# Patient Record
Sex: Female | Born: 1970 | Race: White | Hispanic: No | Marital: Single | Smoking: Former smoker
Health system: Southern US, Community
[De-identification: ages and names within clinical notes are randomized; demographics above are authoritative.]

## PROBLEM LIST (undated history)

## (undated) DIAGNOSIS — R87629 Unspecified abnormal cytological findings in specimens from vagina: Secondary | ICD-10-CM

## (undated) DIAGNOSIS — I10 Essential (primary) hypertension: Secondary | ICD-10-CM

## (undated) DIAGNOSIS — K219 Gastro-esophageal reflux disease without esophagitis: Secondary | ICD-10-CM

## (undated) HISTORY — PX: CERVICAL BIOPSY  W/ LOOP ELECTRODE EXCISION: SUR135

## (undated) HISTORY — PX: TONSILLECTOMY: SUR1361

## (undated) HISTORY — DX: Essential (primary) hypertension: I10

## (undated) HISTORY — PX: COLPOSCOPY: SHX161

## (undated) HISTORY — PX: LEEP: SHX91

## (undated) HISTORY — DX: Unspecified abnormal cytological findings in specimens from vagina: R87.629

---

## 2001-08-11 ENCOUNTER — Emergency Department (HOSPITAL_COMMUNITY): Admission: EM | Admit: 2001-08-11 | Discharge: 2001-08-11 | Payer: Self-pay | Admitting: Emergency Medicine

## 2001-08-11 ENCOUNTER — Encounter: Payer: Self-pay | Admitting: Emergency Medicine

## 2005-08-14 ENCOUNTER — Emergency Department (HOSPITAL_COMMUNITY): Admission: EM | Admit: 2005-08-14 | Discharge: 2005-08-14 | Payer: Self-pay | Admitting: Emergency Medicine

## 2010-01-30 ENCOUNTER — Encounter: Admission: RE | Admit: 2010-01-30 | Discharge: 2010-01-30 | Payer: Self-pay | Admitting: Family Medicine

## 2010-10-25 ENCOUNTER — Encounter: Payer: Self-pay | Admitting: Family Medicine

## 2015-06-17 DIAGNOSIS — I361 Nonrheumatic tricuspid (valve) insufficiency: Secondary | ICD-10-CM | POA: Insufficient documentation

## 2015-09-09 DIAGNOSIS — N83201 Unspecified ovarian cyst, right side: Secondary | ICD-10-CM | POA: Insufficient documentation

## 2015-10-21 ENCOUNTER — Other Ambulatory Visit: Payer: Self-pay

## 2015-10-21 DIAGNOSIS — Z1231 Encounter for screening mammogram for malignant neoplasm of breast: Secondary | ICD-10-CM

## 2015-10-27 ENCOUNTER — Other Ambulatory Visit (HOSPITAL_COMMUNITY): Payer: Self-pay | Admitting: *Deleted

## 2015-10-27 DIAGNOSIS — N644 Mastodynia: Secondary | ICD-10-CM

## 2015-10-30 ENCOUNTER — Ambulatory Visit (HOSPITAL_COMMUNITY)
Admission: RE | Admit: 2015-10-30 | Discharge: 2015-10-30 | Disposition: A | Discharge status: Home / Self Care | Payer: No Typology Code available for payment source | Source: Ambulatory Visit | Attending: Obstetrics and Gynecology | Admitting: Obstetrics and Gynecology

## 2015-10-30 ENCOUNTER — Ambulatory Visit
Admission: RE | Admit: 2015-10-30 | Discharge: 2015-10-30 | Disposition: A | Discharge status: Home / Self Care | Payer: No Typology Code available for payment source | Source: Ambulatory Visit | Attending: Obstetrics and Gynecology | Admitting: Obstetrics and Gynecology

## 2015-10-30 ENCOUNTER — Encounter (HOSPITAL_COMMUNITY): Payer: Self-pay

## 2015-10-30 VITALS — BP 142/86 | Temp 98.2°F | Ht 63.0 in | Wt 171.0 lb

## 2015-10-30 DIAGNOSIS — Z1239 Encounter for other screening for malignant neoplasm of breast: Secondary | ICD-10-CM

## 2015-10-30 DIAGNOSIS — N644 Mastodynia: Secondary | ICD-10-CM

## 2015-10-30 NOTE — Progress Notes (Signed)
Complaints of right breast pain since December 2016 that comes and goes. Patient rated pain at a 1 out of 10 stating it happens at random times.  Pap Smear: Pap smear not completed today. Last Pap smear was in October 2014 at [facility] and normal per patient. Per patient has a history of an abnormal Pap smear 12 years ago that a LEEP was completed for follow up. Patient stated she has had 10 normal Pap smears since LEEP. No Pap smear results in EPIC.  Physical exam: Breasts Breasts symmetrical. No skin abnormalities bilateral breasts. No nipple retraction bilateral breasts. No nipple discharge bilateral breasts. No lymphadenopathy. No lumps palpated bilateral breasts. No complaints of pain or tenderness on exam. Patient complained of the pain being located in her right inner breast. Referred patient to the [place] for diagnostic mammogram. Appointment scheduled for Thursday, October 30, 2015 at 1530.  Pelvic/Bimanual No Pap smear completed today since last Pap smear was in October 2014 per patient. Pap smear not indicated per BCCCP guidelines.   Smoking History: Smoking cessation discussed with patient. Referred patient to the [facility] Quitline and gave resources to the free smoking cessation classes offered at the [facility].  Patient Navigation: Patient education provided. Access to services provided for patient through [facility] program.

## 2015-10-30 NOTE — Patient Instructions (Signed)
Educational materials on self breast awareness given. Explained to Maria Mccarty that she did not need a Pap smear today due to last Pap smear was in October 2014 per patient. Let her know Maria Mccarty will cover Pap smears every 3 years unless has a history of abnormal Pap smears. Referred patient to the [facility] for diagnostic mammogram. Appointment scheduled for Thursday, October 30, 2015 at 1530. Patient aware of appointment and will be there. Smoking cessation discussed with patient. Referred patient to the [facility] Quitline and gave resources to the free smoking cessation classes offered at the [facility]. Maria Mccarty verbalized understanding.  Maria Mccarty, Maria Chaco, RN 2:10 PM

## 2015-11-03 ENCOUNTER — Encounter (HOSPITAL_COMMUNITY): Payer: Self-pay | Admitting: *Deleted

## 2015-11-13 ENCOUNTER — Other Ambulatory Visit: Payer: Self-pay

## 2015-11-13 ENCOUNTER — Ambulatory Visit (HOSPITAL_BASED_OUTPATIENT_CLINIC_OR_DEPARTMENT_OTHER): Payer: Self-pay

## 2015-11-13 VITALS — BP 148/102 | HR 68 | Temp 98.0°F | Resp 16 | Ht 63.0 in | Wt 167.5 lb

## 2015-11-13 DIAGNOSIS — Z0000 Encounter for general adult medical examination without abnormal findings: Secondary | ICD-10-CM

## 2015-11-13 LAB — GLUCOSE (CC13): Glucose: 94 mg/dl (ref 70–140)

## 2015-11-13 NOTE — Progress Notes (Signed)
Patient is a new patient to the [facility] program and is currently a [facility] patient effective 10/30/2015 .  Clinical Measurements: Patient is 5 ft. 3 inches, weight 167.5 lbs, BMI 29.7.   Medical History: Patient has no history That she knows of high cholesterol. Patient does not have a history of hypertension or diabetes. Patient stated has noticed that her blood pressure's have been a little high. Per patient no diagnosed history of coronary heart disease, heart attack, heart failure, stroke/TIA, vascular disease or congenital heart defects.   Blood Pressure, Self-measurement: Patient states has no reason to check Blood pressure.  Nutrition Assessment: Patient stated that eats 2 fruits every day. Patient states she eats 2 servings of vegetables a day. Per patient does not eat 3 or more ounces of whole grains daily. Patient stated that doesn't eat two or more servings of fish weekly. Patient states does like seafood but had a numbing reaction to possible red tide. She got sick on it when she was young. Patient states she does not drink more than 36 ounces or 450 calories of beverages with added sugars weekly. Patient states she drinks a lot of water. Patient stated she does watch her salt intake.   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  Smoking Status: Patient stated smokes occasionally and is not exposed to smoke.  Quality of Life Assessment: In assessing patient's quality of life she stated that out of the past 30 days that she has felt her health is good all of them. Patient also stated that in the past 30 days that her mental health was good including stress, depression and problems with emotions for all days. Patient did state that out of the past 30 days she felt her physical or mental health had not kept her from doing her usual activities including self-care, work or recreation.   Plan: Lab work will be  done today including a lipid panel, blood glucose, and Hgb A1C. Will call lab results when they are finished. Patient will be getting visit to doctor for blood pressure. Will work on behavior modifications changeable risk factors that we discussed to lower BP.

## 2015-11-13 NOTE — Patient Instructions (Signed)
Discussed health assessment with patient. Talked with patient about smoking cessation and gave resources. Gave patient all the dates, times and location. Referred patient to [facility] for high blood pressure. Will quit smoking. Patient verbalized understanding.

## 2015-11-14 LAB — LIPID PANEL
CHOLESTEROL TOTAL: 176 mg/dL (ref 100–199)
Chol/HDL Ratio: 2.7 ratio units (ref 0.0–4.4)
HDL: 66 mg/dL (ref >39)
LDL Calculated: 99 mg/dL (ref 0–99)
Triglycerides: 53 mg/dL (ref 0–149)
VLDL Cholesterol Cal: 11 mg/dL (ref 5–40)

## 2015-11-14 LAB — HEMOGLOBIN A1C
Est. average glucose Bld gHb Est-mCnc: 105 mg/dL
HEMOGLOBIN A1C: 5.3 % (ref 4.8–5.6)

## 2015-11-18 ENCOUNTER — Telehealth: Payer: Self-pay

## 2015-11-18 NOTE — Telephone Encounter (Signed)
Called to inform about lab work from 11/13/15. I informed patient: cholesterol- 176, HDL- 66, LDL- 99, triglycerides - 53, Bld Glucose -94 , HBG-A1C - 5.3, and BMI 29.7. Told patient of appointment at [facility] on Wednesday, Fenruary 22 at 2 PM for elevated blood Pressure. Reassured patient that aware that blood pressure normally is not that high but I am required to send her. Facility address and telephone number given. Patient was informed not to let doctor do any other lab work or injections. [facility] does not cover and patient would be billed.   Did risk reduction counseling/Heach Coach concerning related to BMI/Weight. Patient and I discussed for twenty minutes her eating and exercise. Patient stated did not feel she was eating enough. Patient said that was eating healthy food. Discussed number of calories and weight she should be. Discussed serving sizes and reminded her of her WISEWOMAN overview sheet that received at screening. WSEWOMAN sheet has all food group minimal servings required for day for1600 calorie diet. Discussed exercise and [employer]. Informed of scholarship program at [employer] and how to get application. Discussed other programs that patient has done and compared programs. Discussed about not losing over 2 lbs a week and is better to lose slowly.  NAVIGATION NEEDS ASSESSMENT AND CARE PLAN: Patient does not need additional social support or lack access to services.Patient understands why she has to be followed up for blood pressure. Only barrier may have been that patient is not aware of some services to assist the low income. Patient has been directed to services for [facility].  PLAN: Patient will attend doctor's appointment. Will call patient back after appointment to see how appointment went. Patient and I will discuss more health coaching concerning patients exercise and weight loss goals.

## 2015-11-26 ENCOUNTER — Ambulatory Visit: Payer: No Typology Code available for payment source | Admitting: Internal Medicine

## 2016-01-13 ENCOUNTER — Telehealth: Payer: Self-pay

## 2016-01-13 NOTE — Telephone Encounter (Signed)
Called to follow up and see how was doing with her blood pressure. No answer and left message to return call.

## 2016-02-04 ENCOUNTER — Telehealth: Payer: Self-pay

## 2016-02-04 NOTE — Telephone Encounter (Signed)
Called to follow up on lab results.. Asked patient to return call.

## 2016-03-29 ENCOUNTER — Telehealth: Payer: Self-pay

## 2016-03-29 NOTE — Telephone Encounter (Signed)
This is third attempt to reach patient for health coaching. Patient has not returned calls from messages left and did not go to doctor's appointment. Willattempt one more time.

## 2016-05-03 ENCOUNTER — Telehealth: Payer: Self-pay

## 2016-05-03 NOTE — Telephone Encounter (Signed)
SECOND & FINAL HEALTH COACH SESSION: Called patient and she apologized for not have returned calls. Patient talked about what had been doing. I asked patient about whether she had checked her blood pressure since had not been to the doctor's appointment. Patient talked about how needed money and work that has been doing. Per patient has been checking blood pressure. Patient stated that pressure was sometimes good and sometimes bad. We reviewed changeable and non changeable risk factors for blood pressure. Discussed with patient that really needed to see a doctor and we discussed things that can happen to heart when BP goes untreated or remains borderline. Explained where and how cold obtain a primary care doctor. Patient stated that batteries to her scales were out and needed to get some. Patient stated that had a treadmill. Asked patient how she manages stress. Patient stated that has a hard time when it is related to her Children. Discussed different stress release options. Reminded patient because asked about PAP smear that can come back to [facility] at the end of January and [place] in February. This is final Health Coach Session PLAN:   Will call for Final Assessment in two weeks. Will work on stress relief. Will seek Primary Care Doctor.  TIME: 25 minutes

## 2016-09-30 ENCOUNTER — Encounter (HOSPITAL_COMMUNITY): Payer: Self-pay

## 2016-09-30 ENCOUNTER — Ambulatory Visit (HOSPITAL_COMMUNITY)
Admission: RE | Admit: 2016-09-30 | Discharge: 2016-09-30 | Disposition: A | Discharge status: Home / Self Care | Payer: Self-pay | Source: Ambulatory Visit | Attending: Obstetrics and Gynecology | Admitting: Obstetrics and Gynecology

## 2016-09-30 VITALS — BP 140/100 | Temp 98.1°F | Ht 63.0 in | Wt 167.4 lb

## 2016-09-30 DIAGNOSIS — Z01419 Encounter for gynecological examination (general) (routine) without abnormal findings: Secondary | ICD-10-CM

## 2016-09-30 HISTORY — DX: Gastro-esophageal reflux disease without esophagitis: K21.9

## 2016-09-30 NOTE — Progress Notes (Signed)
Pap smear completed

## 2016-09-30 NOTE — Progress Notes (Signed)
Complaints of AUB x 6 months.  Pap Smear: Pap smear completed today. Last Pap smear was in October 2014 at [facility] and normal per patient. Per patient has a history of an abnormal Pap smear 12 years ago that a LEEP was completed for follow up. Patient stated she has had 10 normal Pap smears since LEEP. No Pap smear results in EPIC.  Pelvic/Bimanual   Ext Genitalia No lesions, no swelling and no discharge observed on external genitalia.         Vagina Vagina pink and normal texture. No lesions and brownish colored blood observed in vagina.          Cervix Cervix is present. Cervix pink and of normal texture. Brownish colored blood observed on cervical os.    Uterus Uterus is present and palpable. Uterus in normal position and normal size.        Adnexae Bilateral ovaries present and palpable. No tenderness on palpation.          Rectovaginal No rectal exam completed today since patient had no rectal complaints. No skin abnormalities observed on exam.    Smoking History: Patient has never smoked.  Patient Navigation: Patient education provided. Access to services provided for patient through [facility] program.

## 2016-09-30 NOTE — Patient Instructions (Addendum)
Explained to Maria Mccarty that [employer] covered Pap smears and HPV typing every 5 years unless has a history of an abnormal Pap smear. Let patient know she may need a Pap smear earlier depending on result due to the vaginal bleeding. Offered to refer patient to the [facility] [place] [facility] to follow up for her AUB. Patient agreed and will refer her to the [facility] [place] [facility] for follow up of abnormal AUB. Let patient know will follow up with her within the next couple weeks with results of Pap smear by phone. Maria Savage Schuld verbalized understanding.  Jameya Pontiff, Arvil Chaco, RN 11:44 AM

## 2016-10-05 LAB — CYTOLOGY - PAP
DIAGNOSIS: NEGATIVE
HPV: NOT DETECTED

## 2016-10-13 ENCOUNTER — Telehealth (HOSPITAL_COMMUNITY): Payer: Self-pay | Admitting: *Deleted

## 2016-10-13 NOTE — Telephone Encounter (Signed)
Telephoned patient at home number and discussed negative pap smear results. HPV was negative. Next pap smear due in five years. Patient voiced understanding.

## 2016-10-25 ENCOUNTER — Other Ambulatory Visit: Payer: Self-pay | Admitting: Obstetrics and Gynecology

## 2016-10-25 DIAGNOSIS — Z1231 Encounter for screening mammogram for malignant neoplasm of breast: Secondary | ICD-10-CM

## 2016-11-03 ENCOUNTER — Encounter: Payer: Self-pay | Admitting: Obstetrics & Gynecology

## 2016-11-03 ENCOUNTER — Ambulatory Visit (INDEPENDENT_AMBULATORY_CARE_PROVIDER_SITE_OTHER): Payer: Self-pay | Admitting: Obstetrics & Gynecology

## 2016-11-03 DIAGNOSIS — N939 Abnormal uterine and vaginal bleeding, unspecified: Secondary | ICD-10-CM | POA: Insufficient documentation

## 2016-11-03 NOTE — Progress Notes (Signed)
GYNECOLOGY OFFICE VISIT NOTE  History:  46 y.o. G3P2103 here today for abnormal uterine bleeding since 11/2015. Has long periods of bleeding, few weeks without any bleeding. Had bleeding throughout the month of 09/2016. Bleeding can be light -> very heavy; uses about 4 pads/day on average. Associated with mild cramping and rare episodes of left sided pain (reports having left ovarian cysts in past).  Reports that her BP has also been elevated during this time, also has insomnia. No mood swings, or night sweats but reports rare hot flashes during the last 3-4 months.  No nausea, fevers, GI/GU symptoms.  No feeling lightheaded or presyncopal.  Past Medical History:  Diagnosis Date  . Acid reflux   . Vaginal Pap smear, abnormal     Past Surgical History:  Procedure Laterality Date  . CERVICAL BIOPSY  W/ LOOP ELECTRODE EXCISION    . COLPOSCOPY    . LEEP    . TONSILLECTOMY     The following portions of the patient's history were reviewed and updated as appropriate: allergies, current medications, past family history, past medical history, past social history, past surgical history and problem list.   Health Maintenance:  Normal pap and negative HRHPV on 09/30/2016.  Normal mammogram on 10/30/2015.   Review of Systems:  Pertinent items noted in HPI and remainder of comprehensive ROS otherwise negative.   Objective:  Physical Exam BP (!) 140/100 Comment: manual recheck  Pulse 62   Ht 5\' 3"  (1.6 m)   Wt 165 lb 3.2 oz (74.9 kg)   LMP 10/23/2016   BMI 29.26 kg/m  CONSTITUTIONAL: Well-developed, well-nourished female in no acute distress.  HENT:  Normocephalic, atraumatic. External right and left ear normal. Oropharynx is clear and moist EYES: Conjunctivae and EOM are normal. Pupils are equal, round, and reactive to light. No scleral icterus.  NECK: Normal range of motion, supple, no masses SKIN: Skin is warm and dry. No rash noted. Not diaphoretic. No erythema. No pallor. NEUROLOGIC:  Alert and oriented to person, place, and time. Normal reflexes, muscle tone coordination. No cranial nerve deficit noted. PSYCHIATRIC: Normal mood and affect. Normal behavior. Normal judgment and thought content. CARDIOVASCULAR: Normal heart rate noted RESPIRATORY: Effort and breath sounds normal, no problems with respiration noted ABDOMEN: Soft, no distention noted.   PELVIC: Normal appearing external genitalia; normal appearing vaginal mucosa and cervix.  No Small amount of red discharge noted, no active bleeding noted.  Normal uterine size, no other palpable masses, no uterine or adnexal tenderness. MUSCULOSKELETAL: Normal range of motion. No edema noted.   Assessment & Plan:  1. Abnormal uterine bleeding (AUB) Talked about evaluation of AUB; stressed importance of endometrial biopsy and ultrasound. Patient is unable to do this now due to cost; feels she will be able to do so in about one month. Will schedule next visit in about one month.  Also offered patient oral progestin therapy, she declined this at this point.  Does not want to take exogenous hormones; more interested in finding out that "everything is okay".  She is more interested in possible surgical management if needed.   Routine preventative health maintenance measures emphasized. Please refer to After Visit Summary for other counseling recommendations.   Return in about 4 weeks (around 12/01/2016) for Endometrial biopsy.   Total face-to-face time with patient: 20 minutes. Over 50% of encounter was spent on counseling and coordination of care.   Verita Schneiders, MD, [place] Attending [place], [facility] for [employer],  [facility]  

## 2016-11-04 NOTE — Patient Instructions (Signed)
Dysfunctional Uterine Bleeding Introduction Dysfunctional uterine bleeding is abnormal bleeding from the uterus. Dysfunctional uterine bleeding includes:  A period that comes earlier or later than usual.  A period that is lighter, heavier, or has blood clots.  Bleeding between periods.  Skipping one or more periods.  Bleeding after sexual intercourse.  Bleeding after menopause. Follow these instructions at home: Pay attention to any changes in your symptoms. Follow these instructions to help with your condition: Eating and drinking  Eat well-balanced meals. Include foods that are high in iron, such as liver, meat, shellfish, green leafy vegetables, and eggs.  If you become constipated:  Drink plenty of water.  Eat fruits and vegetables that are high in water and fiber, such as spinach, carrots, raspberries, apples, and mango. Medicines  Take over-the-counter and prescription medicines only as told by your health care provider.  Do not change medicines without talking with your health care provider.  Aspirin or medicines that contain aspirin may make the bleeding worse. Do not take those medicines:  During the week before your period.  During your period.  If you were prescribed iron pills, take them as told by your health care provider. Iron pills help to replace iron that your body loses because of this condition. Activity  If you need to change your sanitary pad or tampon more than one time every 2 hours:  Lie in bed with your feet raised (elevated).  Place a cold pack on your lower abdomen.  Rest as much as possible until the bleeding stops or slows down.  Do not try to lose weight until the bleeding has stopped and your blood iron level is back to normal. Other Instructions  For two months, write down:  When your period starts.  When your period ends.  When any abnormal bleeding occurs.  What problems you notice.  Keep all follow up visits as told by  your health care provider. This is important. Contact a health care provider if:  You get light-headed or weak.  You have nausea and vomiting.  You cannot eat or drink without vomiting.  You feel dizzy or have diarrhea while you are taking medicines.  You are taking birth control pills or hormones, and you want to change them or stop taking them. Get help right away if:  You develop a fever or chills.  You need to change your sanitary pad or tampon more than one time per hour.  Your bleeding becomes heavier, or your flow contains clots more often.  You develop pain in your abdomen.  You lose consciousness.  You develop a rash. This information is not intended to replace advice given to you by your health care provider. Make sure you discuss any questions you have with your health care provider. Document Released: 09/17/2000 Document Revised: 02/26/2016 Document Reviewed: 12/16/2014  2017 Elsevier

## 2016-11-11 ENCOUNTER — Ambulatory Visit
Admission: RE | Admit: 2016-11-11 | Discharge: 2016-11-11 | Disposition: A | Discharge status: Home / Self Care | Payer: No Typology Code available for payment source | Source: Ambulatory Visit | Attending: Obstetrics and Gynecology | Admitting: Obstetrics and Gynecology

## 2016-11-11 ENCOUNTER — Encounter (HOSPITAL_COMMUNITY): Payer: Self-pay

## 2016-11-11 ENCOUNTER — Ambulatory Visit (HOSPITAL_COMMUNITY)
Admission: RE | Admit: 2016-11-11 | Discharge: 2016-11-11 | Disposition: A | Discharge status: Home / Self Care | Payer: No Typology Code available for payment source | Source: Ambulatory Visit | Attending: Obstetrics and Gynecology | Admitting: Obstetrics and Gynecology

## 2016-11-11 VITALS — BP 136/80 | Temp 98.3°F | Ht 63.0 in | Wt 165.8 lb

## 2016-11-11 DIAGNOSIS — Z1231 Encounter for screening mammogram for malignant neoplasm of breast: Secondary | ICD-10-CM

## 2016-11-11 DIAGNOSIS — Z1239 Encounter for other screening for malignant neoplasm of breast: Secondary | ICD-10-CM

## 2016-11-11 NOTE — Progress Notes (Signed)
No complaints today.   Pap Smear: Pap smear not completed today. Last Pap smear was 09/30/2016 at [facility] and normal with negative HPV. Per patient has a history of an abnormal Pap smear 12 years ago that a LEEP was completed for follow up. Patient stated she has had 11 normal Pap smears since LEEP. Last Pap smear result is in EPIC.  Physical exam: Breasts Breasts symmetrical. No skin abnormalities bilateral breasts. No nipple retraction bilateral breasts. No nipple discharge bilateral breasts. No lymphadenopathy. No lumps palpated bilateral breasts. No complaints of pain or tenderness on exam. Referred patient to the [facility] for a screening mammogram. Appointment scheduled for Thursday, November 11, 2016 at 1040.        Pelvic/Bimanual No Pap smear completed today since last Pap smear and HPV typing was 09/30/2016. Pap smear not indicated per BCCCP guidelines.   Smoking History: Patient has never smoked.  Patient Navigation: Patient education provided. Access to services provided for patient through [facility] program.

## 2016-11-11 NOTE — Patient Instructions (Signed)
Explained breast self awareness with [name]. Patient did not need a Pap smear today due to last Pap smear and HPV typing was 09/30/2016. Let her know her next Pap smear will be due in three years due to her history of an abnormal Pap smear that required a LEEP for follow up. [name] for a screening mammogram. Appointment scheduled for Thursday, November 11, 2016 at 1040. Let [facility] know [facility] will follow up with her within the next couple weeks with results of mammogram by letter or phone. Maria Mccarty Cathy verbalized understanding.  [place], Arvil Chaco, RN 10:27 AM

## 2016-11-15 ENCOUNTER — Telehealth: Payer: Self-pay

## 2016-11-15 ENCOUNTER — Encounter (HOSPITAL_COMMUNITY): Payer: Self-pay | Admitting: *Deleted

## 2016-11-15 NOTE — Telephone Encounter (Signed)
[facility] - Follow up Screening  Assessment: F/U screening.   Patient's goal was to decrease BP.  Initial Screening date: 11/13/2015  Medication Status: Patient is not taking any medication at this time.   Blood Pressure Measurement: BP remains high, per pt PCP recommended to increase physical exercise.   Nutrition: Patient eats 2-3 cups of vegetables and fruit a day in a smoothie. Patient does not eat fish but states she eats a lot of chicken. Patient has decreased her sodium intake and drinks mainly water.   Physical Activity: Patient is not currently active and does not have a job right now.   Smoking Status: Patient quit smoking 6 months ago  Quality of Life: Patient is not currently working so money is an issue at home but feels she is doing well right now.   Navigation: Patient educated to continue following up with her PCP regarding BP and to increase her physical activity as her PCP discussed. Access to services provided through  [facility] provided.

## 2016-11-29 ENCOUNTER — Telehealth: Payer: Self-pay

## 2016-11-29 NOTE — Telephone Encounter (Signed)
Health Coaching 3  Initial Screening: 11/13/15  Patients Goal: Decrease BP  Called patient to do 3rd health coaching and final follow up. Patient states her goal as been to decrease her BP. States her BP remains high and her PCP recommends physical activity. Patient verbalized this will be her ongoing goal and will start with moderate physical activity such as brisk walking 30 minutes a day. Patient states she is at the park during the warm days and will try to find things to do when it is not warm outside. Patient understands this is her final health coaching for this session and will follow up regularly with her PCP.  Plan is to continue seeing her PCP and pt has access to [employer] program.

## 2016-12-21 DIAGNOSIS — N882 Stricture and stenosis of cervix uteri: Secondary | ICD-10-CM | POA: Insufficient documentation

## 2016-12-21 DIAGNOSIS — Z9889 Other specified postprocedural states: Secondary | ICD-10-CM | POA: Insufficient documentation

## 2017-07-25 ENCOUNTER — Encounter (HOSPITAL_COMMUNITY): Payer: Self-pay

## 2017-12-07 ENCOUNTER — Other Ambulatory Visit (HOSPITAL_COMMUNITY): Payer: Self-pay

## 2017-12-07 DIAGNOSIS — Z0000 Encounter for general adult medical examination without abnormal findings: Secondary | ICD-10-CM

## 2017-12-09 ENCOUNTER — Inpatient Hospital Stay: Payer: Self-pay

## 2017-12-09 ENCOUNTER — Inpatient Hospital Stay: Payer: Self-pay | Attending: Obstetrics and Gynecology | Admitting: *Deleted

## 2017-12-09 VITALS — BP 136/88 | Ht 63.75 in | Wt 144.9 lb

## 2017-12-09 DIAGNOSIS — Z0000 Encounter for general adult medical examination without abnormal findings: Secondary | ICD-10-CM

## 2017-12-09 LAB — LIPID PANEL
CHOL/HDL RATIO: 2.5 ratio
Cholesterol: 217 mg/dL — ABNORMAL HIGH (ref 0–200)
HDL: 86 mg/dL (ref >40)
LDL CALC: 123 mg/dL — AB (ref 0–99)
TRIGLYCERIDES: 41 mg/dL (ref <150)
VLDL: 8 mg/dL (ref 0–40)

## 2017-12-09 NOTE — Patient Instructions (Addendum)
Health Coaching: Patient wants to work on her nutrition status and keeping her blood pressure in normal range. Coached patient regarding nutrition and blood pressure.

## 2017-12-09 NOTE — Progress Notes (Signed)
[facility] INITIAL SCREENING  Clinical Measurement: HT: 63.75in  WT: 144.9  BP: 152/84 BPx2: 136/88 fasting labs drawn today, will review with patient when they result.  Medical History: Patient states that she has not been diagnosed with any of the following conditions: hyperlipidemia, HTN, diabetes or heart disease.   Medications: Patient does not take medications for HTN, hyperlipidemia or diabetes. She is not taking aspirin daily to prevent heart attack or stroke.   Blood Pressure, Self Measurement: Patient does measure her blood pressure at home a few times a week.  Nutrition:Patient states that she eats 0 cups of fruit , and 0 cups of vegetables in an average day. She does not eat fish regularly. She eats less than half a serving of whole grains daily. She does not drink less than 36 ounces of beverages with added sugar weekly. She does currently watching her sodium intake. She has not had any drinks containing alcohol in the last 7 days.   Physical Activity: Patient states that she gets 0 minutes of moderate exercise in a week. She gets 0 minutes of vigorous exercise in a week.  Smoking Status: Patient quit smoking more than 12 months ago. She is not around any smokers.  Quality of Life: Patient states that she has has had 10 bad physical health out of the last 30 days. In the last 2 weeks she has had 0 days that she has felt down or depressed. She has had 0 days in the last 2 weeks that she has had little interest or pleasure in doing things.  Risk Reduction and Counseling: Patient wants to work on her nutrition because she has had a lot of stomach problems and has not been eating well. She wants to keep her blood pressure in normal limits.  Navigation: Will notify patient of lab results within a week. Patient aware of two more health coaching sessions and a follow up.

## 2017-12-10 LAB — HEMOGLOBIN A1C
HEMOGLOBIN A1C: 5.3 % (ref 4.8–5.6)
MEAN PLASMA GLUCOSE: 105 mg/dL

## 2017-12-19 ENCOUNTER — Telehealth (HOSPITAL_COMMUNITY): Payer: Self-pay

## 2017-12-19 DIAGNOSIS — R1011 Right upper quadrant pain: Secondary | ICD-10-CM | POA: Insufficient documentation

## 2017-12-19 NOTE — Telephone Encounter (Signed)
Health Coaching #2  Goals: To work on nutrition and decrease BP  Phoned patient to discuss lab results. Fasting labs Total cholesterol 217, HDL 86, LDL 123, Triglycerides 41, A1C 5.3 and glucose 105. Patient voiced understanding regarding her lab results. Patient declined a referral for her blood pressure at this time. States that she is trying to get into an appointment with her GI doctor due to continuing stomach issues. Encouraged patient to phone if she wants to get a referral. Patient knows that I will be calling her again in a few weeks.

## 2017-12-21 DIAGNOSIS — K5904 Chronic idiopathic constipation: Secondary | ICD-10-CM | POA: Insufficient documentation

## 2017-12-21 DIAGNOSIS — K2100 Gastro-esophageal reflux disease with esophagitis, without bleeding: Secondary | ICD-10-CM | POA: Insufficient documentation

## 2018-01-04 ENCOUNTER — Other Ambulatory Visit: Payer: Self-pay | Admitting: Obstetrics and Gynecology

## 2018-01-04 DIAGNOSIS — Z1231 Encounter for screening mammogram for malignant neoplasm of breast: Secondary | ICD-10-CM

## 2018-01-13 ENCOUNTER — Emergency Department (HOSPITAL_COMMUNITY)
Admission: EM | Admit: 2018-01-13 | Discharge: 2018-01-13 | Disposition: A | Discharge status: Home / Self Care | Payer: No Typology Code available for payment source | Attending: Emergency Medicine | Admitting: Emergency Medicine

## 2018-01-13 ENCOUNTER — Encounter (HOSPITAL_COMMUNITY): Payer: Self-pay | Admitting: Emergency Medicine

## 2018-01-13 DIAGNOSIS — Z87891 Personal history of nicotine dependence: Secondary | ICD-10-CM | POA: Insufficient documentation

## 2018-01-13 DIAGNOSIS — R6881 Early satiety: Secondary | ICD-10-CM

## 2018-01-13 DIAGNOSIS — K3184 Gastroparesis: Secondary | ICD-10-CM

## 2018-01-13 LAB — CBC
HCT: 38.3 % (ref 36.0–46.0)
Hemoglobin: 11.7 g/dL — ABNORMAL LOW (ref 12.0–15.0)
MCH: 23.2 pg — ABNORMAL LOW (ref 26.0–34.0)
MCHC: 30.5 g/dL (ref 30.0–36.0)
MCV: 75.8 fL — ABNORMAL LOW (ref 78.0–100.0)
PLATELETS: 275 10*3/uL (ref 150–400)
RBC: 5.05 MIL/uL (ref 3.87–5.11)
RDW: 17.2 % — AB (ref 11.5–15.5)
WBC: 4.6 10*3/uL (ref 4.0–10.5)

## 2018-01-13 LAB — COMPREHENSIVE METABOLIC PANEL
ALT: 58 U/L — AB (ref 14–54)
AST: 37 U/L (ref 15–41)
Albumin: 4.6 g/dL (ref 3.5–5.0)
Alkaline Phosphatase: 63 U/L (ref 38–126)
Anion gap: 11 (ref 5–15)
BUN: 7 mg/dL (ref 6–20)
CALCIUM: 9.7 mg/dL (ref 8.9–10.3)
CO2: 28 mmol/L (ref 22–32)
Chloride: 100 mmol/L — ABNORMAL LOW (ref 101–111)
Creatinine, Ser: 0.62 mg/dL (ref 0.44–1.00)
GFR calc Af Amer: >60 mL/min (ref >60)
Glucose, Bld: 92 mg/dL (ref 65–99)
Potassium: 4.1 mmol/L (ref 3.5–5.1)
Sodium: 139 mmol/L (ref 135–145)
TOTAL PROTEIN: 7.8 g/dL (ref 6.5–8.1)
Total Bilirubin: 0.6 mg/dL (ref 0.3–1.2)

## 2018-01-13 LAB — URINALYSIS, ROUTINE W REFLEX MICROSCOPIC
Bilirubin Urine: NEGATIVE
GLUCOSE, UA: NEGATIVE mg/dL
Hgb urine dipstick: NEGATIVE
KETONES UR: 20 mg/dL — AB
LEUKOCYTES UA: NEGATIVE
NITRITE: NEGATIVE
PROTEIN: NEGATIVE mg/dL
Specific Gravity, Urine: 1.017 (ref 1.005–1.030)
pH: 6 (ref 5.0–8.0)

## 2018-01-13 LAB — I-STAT BETA HCG BLOOD, ED (MC, WL, AP ONLY): I-stat hCG, quantitative: <5 m[IU]/mL (ref <5)

## 2018-01-13 LAB — LIPASE, BLOOD: Lipase: 25 U/L (ref 11–51)

## 2018-01-13 MED ORDER — SUCRALFATE 1 GM/10ML PO SUSP: 1.0000 g | Freq: Three times a day (TID) | ORAL | 0 refills | Status: DC

## 2018-01-13 MED ORDER — GI COCKTAIL ~~LOC~~
30.0000 mL | Freq: Once | ORAL | Status: DC
  Filled 2018-01-13: qty 30

## 2018-01-13 NOTE — ED Provider Notes (Signed)
[facility] DEPT Provider Note   CSN: [id number] Arrival date & time: 01/13/18  1652     History   Chief Complaint Chief Complaint  Patient presents with  . Abdominal Pain  . Fatigue  . Constipation    HPI Maria Mccarty is a 47 y.o. female presenting for evaluation of abdominal discomfort, early satiety, and fatigue.  Patient states for the past year, she has been having abdominal issues including early satiety and feelings of fullness.  Over the past 3 months, this has worsened.  She states that she can eat a few bites of solid food before she feels overly full.  She is able to tolerate liquids, but eventually starts to feel full as well.  She has associated constipation, although has very poor oral intake.  Does not feel the need to have a bowel movement.  She denies fevers, chills, chest pain, shortness of breath, nausea, vomiting, urinary symptoms.  She has had extensive workup with no vomiting [facility] gastroenterology as well as multiple visits to the ER.  Prior EGD shows gastritis with esophagitis and gastroparesis.  Ultrasound and CT scan without concerning findings, showed gallbladder sludge without signs of cholecystitis.  Currently on Protonix daily, Pepcid as needed.  Was given prescription for Reglan, but is concerned about the side effects, so has not started this. Additionally, pt had her thyroid fnx checked last week, which showed elevated TSH, normal T3, T4.   HPI  Past Medical History:  Diagnosis Date  . Acid reflux   . Vaginal Pap smear, abnormal     Patient Active Problem List   Diagnosis Date Noted  . Abnormal uterine bleeding (AUB) 11/03/2016    Past Surgical History:  Procedure Laterality Date  . CERVICAL BIOPSY  W/ LOOP ELECTRODE EXCISION    . COLPOSCOPY    . LEEP    . TONSILLECTOMY       OB History    Gravida  3   Para  3   Term  2   Preterm  1   AB      Living  3     SAB      TAB      Ectopic        Multiple      Live Births               Home Medications    Prior to Admission medications   Medication Sig Start Date End Date Taking? Authorizing Provider  Cholecalciferol (VITAMIN D) 2000 units CAPS Take 2,000 Units by mouth daily.   Yes [provider]  famotidine (PEPCID AC) 10 MG chewable tablet Chew 10 mg by mouth 2 (two) times daily.   Yes [provider]  pantoprazole (PROTONIX) 40 MG tablet Take 40 mg by mouth daily. 12/21/17  Yes [provider]  sucralfate (CARAFATE) 1 GM/10ML suspension Take 10 mLs (1 g total) by mouth 4 (four) times daily -  with meals and at bedtime. 01/13/18   Jenisse Vullo, PA-C    Family History Family History  Problem Relation Age of Onset  . Hypertension Brother   . Stroke Paternal Grandmother     Social History Social History   Tobacco Use  . Smoking status: Former Smoker    Last attempt to quit: 03/04/2016    Years since quitting: 1.8  . Smokeless tobacco: Never Used  Substance Use Topics  . Alcohol use: No  . Drug use: No  Allergies   Penicillins   Review of Systems Review of Systems  Constitutional: Positive for appetite change.  Gastrointestinal: Positive for constipation.       Early abdominal fullness/satiety  All other systems reviewed and are negative.    Physical Exam Updated Vital Signs BP (!) 145/85   Pulse 63   Temp 98.6 F (37 C) (Oral)   Resp 18   Ht 5\' 5"  (1.651 m)   Wt 59.4 kg (131 lb)   LMP 12/05/2017   SpO2 100%   BMI 21.80 kg/m   Physical Exam  Constitutional: She is oriented to person, place, and time. She appears well-developed and well-nourished. No distress.  Sitting comfortably in the bed in no apparent distress.  Does not appear cachectic or dehydrated.  HENT:  Head: Normocephalic and atraumatic.  Mouth/Throat: Uvula is midline, oropharynx is clear and moist and mucous membranes are normal.  Eyes: Pupils are equal, round, and reactive to light.  Conjunctivae and EOM are normal.  Neck: Normal range of motion. Neck supple.  Cardiovascular: Normal rate, regular rhythm and intact distal pulses.  Pulmonary/Chest: Effort normal and breath sounds normal. No respiratory distress. She has no wheezes.  Abdominal: Soft. She exhibits no distension and no mass. There is no tenderness. There is no guarding.  No TTP of the abd. No distention, rigidity, or guarding.   Musculoskeletal: Normal range of motion.  Neurological: She is alert and oriented to person, place, and time.  Skin: Skin is warm and dry. Capillary refill takes less than 2 seconds.  Psychiatric: She has a normal mood and affect.  Nursing note and vitals reviewed.    ED Treatments / Results  Labs (all labs ordered are listed, but only abnormal results are displayed) Labs Reviewed  COMPREHENSIVE METABOLIC PANEL - Abnormal; Notable for the following components:      Result Value   Chloride 100 (*)    ALT 58 (*)    All other components within normal limits  CBC - Abnormal; Notable for the following components:   Hemoglobin 11.7 (*)    MCV 75.8 (*)    MCH 23.2 (*)    RDW 17.2 (*)    All other components within normal limits  URINALYSIS, ROUTINE W REFLEX MICROSCOPIC - Abnormal; Notable for the following components:   Ketones, ur 20 (*)    All other components within normal limits  LIPASE, BLOOD  I-STAT BETA HCG BLOOD, ED ([facility], [facility], [facility] ONLY)    EKG None  Radiology No results found.  Procedures Procedures (including critical care time)  Medications Ordered in ED Medications  gi cocktail (Maalox,Lidocaine,Donnatal) (30 mLs Oral Not Given 01/13/18 2055)     Initial Impression / Assessment and Plan / ED Course  I have reviewed the triage vital signs and the nursing notes.  Pertinent labs & imaging results that were available during my care of the patient were reviewed by me and considered in my medical decision making (see chart for details).     Patient  presenting for evaluation of several month history of early satiety and abdominal fullness.  This is been worked up extensively by gastroenterology and previous ER visits.  CT and ultrasound since symptoms have began have been normal.  Today, labs reassuring, no leukocytosis.  Creatinine stable.  No significant electrolyte abnormalities.  Hemoglobin stable.  Urine without signs of infection or significant dehydration.  Vitals stable. No sign of myxedema or thyroid emergency at this time. I do not believe further imaging would  be beneficial. Will give GI cocktail for sx control. Will d/c with Carafate and instructions to try reglan. Information given for PCP, endocrine,a dn GI for further eval. discussed at length importance of diet. Return precautions given.  At this time, patient appears safe for discharge.  She states she understands and agrees to plan.   Final Clinical Impressions(s) / ED Diagnoses   Final diagnoses:  Early satiety  Gastroparesis    ED Discharge Orders        Ordered    sucralfate (CARAFATE) 1 GM/10ML suspension  3 times daily with meals & bedtime     01/13/18 2126       Franchot Heidelberg, PA-C 01/13/18 2305    Charlesetta Shanks, MD 01/16/18 1807

## 2018-01-13 NOTE — ED Triage Notes (Signed)
Pt c/o abd pains, fatigue, and had n/v/d last couple weeks. Reports that she hasnt had BM since last Friday. Has lost 35lb in last year due to all her abd issues.

## 2018-01-13 NOTE — ED Notes (Signed)
Sofi EDPA made aware re pt's inability to take the medicine as ordered.

## 2018-01-13 NOTE — ED Notes (Signed)
Pt unable to take GI cocktail, after taking 2 small sips, pt started to feel the medicine is getting stuck in her throat.  States "it's bubbling up in there."  Pt able to speak in complete sentences without pausing for breath.  Appears in NAD.

## 2018-01-13 NOTE — Discharge Instructions (Signed)
Try to focus on a nutrient-rich, liquid diet, as opposed to solid foods. Continue taking protonix daily.  Use carafate as needed or 3 times a day with oral intake.  Follow up with the GI, endocrine, and PCP clinics listed below for further evaluation.  Return to the ER if you develop fevers, chills, severe abdominal pain, or any new or concerning symptoms.

## 2018-01-24 ENCOUNTER — Ambulatory Visit (HOSPITAL_COMMUNITY): Payer: No Typology Code available for payment source

## 2018-02-09 ENCOUNTER — Other Ambulatory Visit (HOSPITAL_COMMUNITY): Payer: Self-pay | Admitting: Surgery

## 2018-02-09 DIAGNOSIS — R1310 Dysphagia, unspecified: Secondary | ICD-10-CM

## 2018-02-14 ENCOUNTER — Telehealth: Payer: Self-pay

## 2018-02-14 ENCOUNTER — Other Ambulatory Visit: Payer: Self-pay

## 2018-02-14 NOTE — Telephone Encounter (Signed)
Referral from Dr [facility] for an esophageal manometry and [facility] probe testing for dysphagia. I spoke with the patient. She wants to postpone the testing until she speaks with the financial services office. She states she is concerned about the cost and tell me she cash pay.  The patient has the telephone number for financial office and my contact information here at [place]

## 2018-02-20 ENCOUNTER — Encounter (HOSPITAL_COMMUNITY): Payer: Self-pay

## 2018-02-20 ENCOUNTER — Ambulatory Visit (HOSPITAL_COMMUNITY): Admit: 2018-02-20 | Payer: No Typology Code available for payment source | Admitting: Gastroenterology

## 2018-02-20 SURGERY — MANOMETRY, ESOPHAGUS

## 2018-02-22 DIAGNOSIS — K3184 Gastroparesis: Secondary | ICD-10-CM | POA: Insufficient documentation

## 2018-02-22 DIAGNOSIS — E039 Hypothyroidism, unspecified: Secondary | ICD-10-CM | POA: Insufficient documentation

## 2018-02-23 DIAGNOSIS — E785 Hyperlipidemia, unspecified: Secondary | ICD-10-CM | POA: Insufficient documentation

## 2018-02-28 ENCOUNTER — Other Ambulatory Visit: Payer: Self-pay | Admitting: Otolaryngology

## 2018-02-28 DIAGNOSIS — R1310 Dysphagia, unspecified: Secondary | ICD-10-CM

## 2018-02-28 DIAGNOSIS — R1319 Other dysphagia: Secondary | ICD-10-CM

## 2018-03-07 ENCOUNTER — Ambulatory Visit
Admission: RE | Admit: 2018-03-07 | Discharge: 2018-03-07 | Disposition: A | Discharge status: Home / Self Care | Payer: No Typology Code available for payment source | Source: Ambulatory Visit | Attending: Otolaryngology | Admitting: Otolaryngology

## 2018-03-07 ENCOUNTER — Other Ambulatory Visit: Payer: Self-pay | Admitting: Otolaryngology

## 2018-03-07 DIAGNOSIS — R1310 Dysphagia, unspecified: Secondary | ICD-10-CM

## 2018-03-07 DIAGNOSIS — R1319 Other dysphagia: Secondary | ICD-10-CM

## 2018-03-08 ENCOUNTER — Inpatient Hospital Stay: Admission: RE | Admit: 2018-03-08 | Payer: No Typology Code available for payment source | Source: Ambulatory Visit

## 2018-03-08 ENCOUNTER — Other Ambulatory Visit: Payer: No Typology Code available for payment source

## 2018-03-16 ENCOUNTER — Other Ambulatory Visit (HOSPITAL_COMMUNITY): Payer: Self-pay | Admitting: Gastroenterology

## 2018-03-16 DIAGNOSIS — R140 Abdominal distension (gaseous): Secondary | ICD-10-CM

## 2018-03-22 ENCOUNTER — Encounter (HOSPITAL_COMMUNITY): Payer: Self-pay

## 2018-03-22 ENCOUNTER — Encounter (HOSPITAL_COMMUNITY)
Admission: RE | Admit: 2018-03-22 | Discharge: 2018-03-22 | Disposition: A | Discharge status: Home / Self Care | Payer: Self-pay | Source: Ambulatory Visit | Attending: Gastroenterology | Admitting: Gastroenterology

## 2018-03-22 DIAGNOSIS — R140 Abdominal distension (gaseous): Secondary | ICD-10-CM | POA: Insufficient documentation

## 2018-03-22 MED ORDER — TECHNETIUM TC 99M SULFUR COLLOID
2.0000 | Freq: Once | INTRAVENOUS | Status: AC | PRN
  Administered 2018-03-22: 2 via ORAL

## 2018-03-29 ENCOUNTER — Ambulatory Visit
Admission: RE | Admit: 2018-03-29 | Discharge: 2018-03-29 | Disposition: A | Discharge status: Home / Self Care | Payer: No Typology Code available for payment source | Source: Ambulatory Visit | Attending: Otolaryngology | Admitting: Otolaryngology

## 2018-03-29 ENCOUNTER — Encounter: Payer: Self-pay | Admitting: Radiology

## 2018-03-29 DIAGNOSIS — R1319 Other dysphagia: Secondary | ICD-10-CM

## 2018-03-29 DIAGNOSIS — R1310 Dysphagia, unspecified: Secondary | ICD-10-CM

## 2018-04-10 ENCOUNTER — Other Ambulatory Visit: Payer: Self-pay

## 2018-04-11 ENCOUNTER — Other Ambulatory Visit: Payer: Self-pay

## 2018-04-14 ENCOUNTER — Telehealth (HOSPITAL_COMMUNITY): Payer: Self-pay | Admitting: *Deleted

## 2018-04-14 NOTE — Telephone Encounter (Signed)
   Health Coaching 3  Goals- Spoke with patient about her goals to eat more fruits, vegetables and to drink more water. Patient states that at the moment she is going through some health issues and is undergoing a battery of tests related to GI issues and due to this she is unable to concentrate on her goals.  Navigation:  Due to her health issues at the moment, I will do another follow up. Patient is aware of  more health coaching sessions and a follow up.

## 2018-04-17 ENCOUNTER — Other Ambulatory Visit: Payer: Self-pay | Admitting: Gastroenterology

## 2018-04-17 DIAGNOSIS — R109 Unspecified abdominal pain: Secondary | ICD-10-CM

## 2018-04-17 DIAGNOSIS — R195 Other fecal abnormalities: Secondary | ICD-10-CM

## 2018-04-24 ENCOUNTER — Ambulatory Visit (HOSPITAL_COMMUNITY)
Admission: RE | Admit: 2018-04-24 | Discharge: 2018-04-24 | Disposition: A | Discharge status: Home / Self Care | Payer: No Typology Code available for payment source | Source: Ambulatory Visit | Attending: Gastroenterology | Admitting: Gastroenterology

## 2018-04-24 ENCOUNTER — Encounter (HOSPITAL_COMMUNITY)
Admission: RE | Disposition: A | Discharge status: Home / Self Care | Payer: Self-pay | Source: Ambulatory Visit | Attending: Gastroenterology

## 2018-04-24 DIAGNOSIS — K219 Gastro-esophageal reflux disease without esophagitis: Secondary | ICD-10-CM | POA: Insufficient documentation

## 2018-04-24 DIAGNOSIS — R1310 Dysphagia, unspecified: Secondary | ICD-10-CM | POA: Insufficient documentation

## 2018-04-24 HISTORY — PX: PH IMPEDANCE STUDY: SHX5565

## 2018-04-24 HISTORY — PX: 24 HOUR PH STUDY: SHX5419

## 2018-04-24 HISTORY — PX: ESOPHAGEAL MANOMETRY: SHX5429

## 2018-04-24 SURGERY — MANOMETRY, ESOPHAGUS

## 2018-04-24 MED ORDER — LIDOCAINE VISCOUS HCL 2 % MT SOLN
OROMUCOSAL | Status: AC
  Filled 2018-04-24: qty 15

## 2018-04-24 SURGICAL SUPPLY — 2 items
FACESHIELD LNG OPTICON STERILE (SAFETY) IMPLANT
GLOVE BIO SURGEON STRL SZ8 (GLOVE) ×8 IMPLANT

## 2018-04-24 NOTE — Progress Notes (Signed)
Esophageal Manometry done per protocol without complication or distress. Probe removed. Ph probe placed at 35cm per protocol. Study monitor and study went over using teachback with patient.Patient verbalized understnading. Pt will return to unit tomorrow at 0930 to have probe removed and monitor downloaded.

## 2018-04-26 ENCOUNTER — Ambulatory Visit
Admission: RE | Admit: 2018-04-26 | Discharge: 2018-04-26 | Disposition: A | Discharge status: Home / Self Care | Payer: No Typology Code available for payment source | Source: Ambulatory Visit | Attending: Gastroenterology | Admitting: Gastroenterology

## 2018-04-26 DIAGNOSIS — R109 Unspecified abdominal pain: Secondary | ICD-10-CM

## 2018-04-26 DIAGNOSIS — R195 Other fecal abnormalities: Secondary | ICD-10-CM

## 2018-04-27 ENCOUNTER — Encounter (HOSPITAL_COMMUNITY): Payer: Self-pay | Admitting: Gastroenterology

## 2018-05-01 DIAGNOSIS — K219 Gastro-esophageal reflux disease without esophagitis: Secondary | ICD-10-CM | POA: Insufficient documentation

## 2018-05-03 DIAGNOSIS — K219 Gastro-esophageal reflux disease without esophagitis: Secondary | ICD-10-CM

## 2018-05-03 DIAGNOSIS — R1310 Dysphagia, unspecified: Secondary | ICD-10-CM

## 2019-12-20 ENCOUNTER — Ambulatory Visit: Payer: Self-pay | Attending: Internal Medicine

## 2019-12-20 DIAGNOSIS — Z23 Encounter for immunization: Secondary | ICD-10-CM

## 2019-12-20 NOTE — Progress Notes (Signed)
   Covid-19 Vaccination Clinic  Name:  Maria Mccarty    MRN: [id number] DOB: [birth year]  12/20/2019  [name] was observed post Covid-19 immunization for 15 minutes without incident. She was provided with Vaccine Information Sheet and instruction to access the V-Safe system.   [name] was instructed to call 911 with any severe reactions post vaccine: Marland Kitchen Difficulty breathing  . Swelling of face and throat  . A fast heartbeat  . A bad rash all over body  . Dizziness and weakness   Immunizations Administered    Name Date Dose VIS Date Route   Pfizer COVID-19 Vaccine 12/20/2019 12:09 PM 0.3 mL 09/14/2019 Intramuscular   Manufacturer: [place]   Lot: [id number]   [place]: [id number]

## 2020-01-02 ENCOUNTER — Encounter: Payer: Self-pay | Admitting: Medical-Surgical

## 2020-01-02 ENCOUNTER — Ambulatory Visit (INDEPENDENT_AMBULATORY_CARE_PROVIDER_SITE_OTHER): Payer: 59 | Admitting: Medical-Surgical

## 2020-01-02 ENCOUNTER — Other Ambulatory Visit: Payer: Self-pay

## 2020-01-02 VITALS — BP 162/91 | HR 70 | Temp 98.2°F | Ht 62.5 in | Wt 130.5 lb

## 2020-01-02 DIAGNOSIS — R300 Dysuria: Secondary | ICD-10-CM | POA: Diagnosis not present

## 2020-01-02 DIAGNOSIS — Z8639 Personal history of other endocrine, nutritional and metabolic disease: Secondary | ICD-10-CM | POA: Insufficient documentation

## 2020-01-02 DIAGNOSIS — Z7185 Encounter for immunization safety counseling: Secondary | ICD-10-CM

## 2020-01-02 DIAGNOSIS — I10 Essential (primary) hypertension: Secondary | ICD-10-CM | POA: Insufficient documentation

## 2020-01-02 DIAGNOSIS — R6889 Other general symptoms and signs: Secondary | ICD-10-CM | POA: Insufficient documentation

## 2020-01-02 DIAGNOSIS — Z0000 Encounter for general adult medical examination without abnormal findings: Secondary | ICD-10-CM | POA: Insufficient documentation

## 2020-01-02 DIAGNOSIS — K219 Gastro-esophageal reflux disease without esophagitis: Secondary | ICD-10-CM

## 2020-01-02 DIAGNOSIS — Z7189 Other specified counseling: Secondary | ICD-10-CM

## 2020-01-02 LAB — POCT URINALYSIS DIP (CLINITEK)
Bilirubin, UA: NEGATIVE
Blood, UA: NEGATIVE
Glucose, UA: NEGATIVE mg/dL
Ketones, POC UA: NEGATIVE mg/dL
Leukocytes, UA: NEGATIVE
Nitrite, UA: NEGATIVE
POC PROTEIN,UA: NEGATIVE
Spec Grav, UA: 1.01 (ref 1.010–1.025)
Urobilinogen, UA: 0.2 E.U./dL
pH, UA: 6 (ref 5.0–8.0)

## 2020-01-02 MED ORDER — LISINOPRIL-HYDROCHLOROTHIAZIDE 20-12.5 MG PO TABS: 1.0000 | ORAL_TABLET | Freq: Every day | ORAL | 3 refills | Status: DC

## 2020-01-02 NOTE — Assessment & Plan Note (Signed)
Currently stable with as needed OTC medications.  If symptoms worsen, may benefit from referral to GI for discussion about TIF procedure.  Patient does not want to be on PPIs long-term as she is postmenopausal and at elevated risk for osteoporosis.

## 2020-01-02 NOTE — Assessment & Plan Note (Signed)
Checking thyroid panel today.  Previous history of labile TSH.

## 2020-01-02 NOTE — Assessment & Plan Note (Signed)
POCT UA negative today but still has burning with urination and suprapubic discomfort intermittently.  Referring to urology for further evaluation.

## 2020-01-02 NOTE — Assessment & Plan Note (Signed)
Blood pressure elevated in office today.  Recheck was 171/85.  Adding hydrochlorothiazide for better blood pressure management.  Start lisinopril-HCTZ 20-12.5 mg daily.  Continue to monitor blood pressures at home and limit sodium intake.

## 2020-01-02 NOTE — Progress Notes (Signed)
New Patient Office Visit  Subjective:  Patient ID: Maria Mccarty, female    DOB: 12-Jan-1971  Age: 49 y.o. MRN: [id number]  CC:  Chief Complaint  Patient presents with  . Establish Care    HPI [name] presents to establish care and discuss several concerns today.  Last preventative care lab work drawn in 2019.  Sees OB/GYN for Pap and breast health.  Hypertension-taking lisinopril 20 mg daily.  Checks blood pressure at home 2-3 times weekly but reports using a manual cuff.  Readings at home ranging from 120-130/80-90.  Eats very healthy avoids added sodium.  Reports her diet mainly consists of salads, smoothies, and small healthy meals.  Walks several times a week for approximately 1 hour for exercise.  Feels that her anxiety makes her blood pressure higher and reports she has high readings with doctor's appointments.  Cold intolerance-reports she is cold all the time.  Notes that she is postmenopausal but has never had hot flashes.  Was evaluated by endocrinology approximately 1-1/2 years ago where she had thyroid levels drawn with varying results, 1 result high, the next low.  Dysuria-reports dysuria for many months and has been seen by gynecology in the past.  Has burning with urination most of the time.  Notes her urine has changed in color as it is now a darker yellow than usual, looks hazy and has a strong odor.  Drinking plenty of fluids and staying well-hydrated.  Noted blood in her urine in January, was evaluated at urgent care and given antibiotics which did not help.  Return to urgent care and had another urine drawn showing hematuria.  No further work-up after that.  Per record review gynecology recommends GI evaluation.  Has a colonoscopy scheduled already.  GERD-previously treated with pantoprazole but switched to Nexium.  Has since weaned herself off Nexium because she is worried about osteoporosis since she is postmenopausal.  Reports her GERD symptoms are stable at this  point.  Vitamin D deficiency-takes vitamin D 2000 units daily.  Would like to have her vitamin D levels checked today.  Reports receiving her first Covid vaccine approximately 2 weeks ago.  Starting shortly after her vaccination, she reports feeling shortness of breath/difficulty breathing that lasted for several days then spontaneously resolved.  She also reports intermittent muscle twitches in her upper and lower extremities since her vaccination.  Reports feeling "not like herself" for the last 2 weeks.  Is concerned about receiving the second vaccination.  Discussed this in length.  Past Medical History:  Diagnosis Date  . Acid reflux   . Hypertension   . Vaginal Pap smear, abnormal     Past Surgical History:  Procedure Laterality Date  . [place] STUDY N/A 04/24/2018   Procedure: [place] STUDY;  Surgeon: Mauri Pole, MD;  Location: [facility] ENDOSCOPY;  Service: Endoscopy;  Laterality: N/A;  . CERVICAL BIOPSY  W/ LOOP ELECTRODE EXCISION    . COLPOSCOPY    . ESOPHAGEAL MANOMETRY N/A 04/24/2018   Procedure: ESOPHAGEAL MANOMETRY (EM);  Surgeon: Mauri Pole, MD;  Location: [facility] ENDOSCOPY;  Service: Endoscopy;  Laterality: N/A;  . LEEP    . [place] IMPEDANCE STUDY  04/24/2018   Procedure: [place] IMPEDANCE STUDY;  Surgeon: Mauri Pole, MD;  Location: [facility] ENDOSCOPY;  Service: Endoscopy;;  . TONSILLECTOMY      Family History  Problem Relation Age of Onset  . Hypertension Brother   . Stroke Paternal Grandmother   . Hypertension  Mother   . Hypertension Brother     Social History   Socioeconomic History  . Marital status: Single    Spouse name: Not on file  . Number of children: Not on file  . Years of education: Not on file  . Highest education level: Not on file  Occupational History  . Not on file  Tobacco Use  . Smoking status: Former Smoker    Quit date: 03/04/2016    Years since quitting: 3.8  . Smokeless tobacco: Never Used  Substance and Sexual Activity  .  Alcohol use: No  . Drug use: No  . Sexual activity: Not Currently    Birth control/protection: None  Other Topics Concern  . Not on file  Social History Narrative  . Not on file   Social Determinants of Health   Financial Resource Strain:   . Difficulty of Paying Living Expenses:   Food Insecurity:   . Worried About Charity fundraiser in the Last Year:   . Arboriculturist in the Last Year:   Transportation Needs:   . Film/video editor (Medical):   Marland Kitchen Lack of Transportation (Non-Medical):   Physical Activity:   . Days of Exercise per Week:   . Minutes of Exercise per Session:   Stress:   . Feeling of Stress :   Social Connections:   . Frequency of Communication with Friends and Family:   . Frequency of Social Gatherings with Friends and Family:   . Attends Religious Services:   . Active Member of Clubs or Organizations:   . Attends Archivist Meetings:   Marland Kitchen Marital Status:   Intimate Partner Violence:   . Fear of Current or Ex-Partner:   . Emotionally Abused:   Marland Kitchen Physically Abused:   . Sexually Abused:     ROS Review of Systems  Constitutional: Negative for chills and fever.  Respiratory: Negative for cough, chest tightness and shortness of breath.   Cardiovascular: Negative for chest pain, palpitations and leg swelling.  Endocrine: Positive for cold intolerance.  Genitourinary: Positive for dysuria.  Psychiatric/Behavioral: The patient is nervous/anxious.     Objective:   Today's Vitals: BP (!) 162/91   Pulse 70   Temp 98.2 F (36.8 C) (Oral)   Ht 5' 2.5" (1.588 m)   Wt 130 lb 8 oz (59.2 kg)   LMP 12/05/2017   SpO2 99%   BMI 23.49 kg/m   Physical Exam Vitals reviewed.  Constitutional:      General: She is not in acute distress.    Appearance: Normal appearance. She is normal weight.  HENT:     Head: Normocephalic and atraumatic.  Cardiovascular:     Rate and Rhythm: Normal rate and regular rhythm.     Pulses: Normal pulses.      Heart sounds: Normal heart sounds. No murmur. No friction rub. No gallop.   Pulmonary:     Effort: Pulmonary effort is normal. No respiratory distress.     Breath sounds: Normal breath sounds. No wheezing.  Abdominal:     General: Abdomen is flat. Bowel sounds are normal.     Palpations: Abdomen is soft.  Skin:    General: Skin is warm and dry.  Neurological:     Mental Status: She is alert and oriented to person, place, and time.  Psychiatric:        Mood and Affect: Mood normal.        Behavior: Behavior normal.  Thought Content: Thought content normal.        Judgment: Judgment normal.     Assessment & Plan:   Essential hypertension Blood pressure elevated in office today.  Recheck was 171/85.  Adding hydrochlorothiazide for better blood pressure management.  Start lisinopril-HCTZ 20-12.5 mg daily.  Continue to monitor blood pressures at home and limit sodium intake.  Gastroesophageal reflux disease Currently stable with as needed OTC medications.  If symptoms worsen, may benefit from referral to GI for discussion about TIF procedure.  Patient does not want to be on PPIs long-term as she is postmenopausal and at elevated risk for osteoporosis.  Cold intolerance Checking thyroid panel today.  Previous history of labile TSH.  Dysuria POCT UA negative today but still has burning with urination and suprapubic discomfort intermittently.  Referring to urology for further evaluation.  History of vitamin D deficiency Checking vitamin D levels today.  Preventative health care Checking CBC, CMP, and lipid panel today.  COVID-19 vaccination counseling Recommend completing second COVID-19 vaccination to obtain fully vaccinated status.  Reviewed risks and benefits and answered all questions.  Patient will ultimately make decision based on personal preferences.  If she proceeds with the second vaccine, she will need to alert vaccinating staff of prior reaction for closer  monitoring.  Outpatient Encounter Medications as of 01/02/2020  Medication Sig  . [DISCONTINUED] lisinopril (ZESTRIL) 20 MG tablet Take 20 mg by mouth daily.  . Cholecalciferol (VITAMIN D) 2000 units CAPS Take 2,000 Units by mouth daily.  . famotidine (PEPCID AC) 10 MG chewable tablet Chew 10 mg by mouth 2 (two) times daily.  Marland Kitchen lisinopril-hydrochlorothiazide (ZESTORETIC) 20-12.5 MG tablet Take 1 tablet by mouth daily.  . pantoprazole (PROTONIX) 40 MG tablet Take 40 mg by mouth daily.  . [DISCONTINUED] lisinopril-hydrochlorothiazide (ZESTORETIC) 20-12.5 MG tablet Take 1 tablet by mouth daily.  . [DISCONTINUED] sucralfate (CARAFATE) 1 GM/10ML suspension Take 10 mLs (1 g total) by mouth 4 (four) times daily -  with meals and at bedtime. (Patient not taking: Reported on 01/02/2020)   No facility-administered encounter medications on file as of 01/02/2020.    Follow-up: Return in about 2 weeks (around 01/16/2020) for nurse visit for BP check.   Clearnce Sorrel, DNP, APRN, FNP-BC [facility]

## 2020-01-02 NOTE — Assessment & Plan Note (Signed)
Checking vitamin D levels today.

## 2020-01-02 NOTE — Assessment & Plan Note (Signed)
Checking CBC, CMP, and lipid panel today.

## 2020-01-03 LAB — CBC
HCT: 43.9 % (ref 35.0–45.0)
Hemoglobin: 14.7 g/dL (ref 11.7–15.5)
MCH: 28.9 pg (ref 27.0–33.0)
MCHC: 33.5 g/dL (ref 32.0–36.0)
MCV: 86.4 fL (ref 80.0–100.0)
MPV: 11.4 fL (ref 7.5–12.5)
Platelets: 278 10*3/uL (ref 140–400)
RBC: 5.08 10*6/uL (ref 3.80–5.10)
RDW: 13 % (ref 11.0–15.0)
WBC: 3.6 10*3/uL — ABNORMAL LOW (ref 3.8–10.8)

## 2020-01-03 LAB — THYROID PANEL WITH TSH
Free Thyroxine Index: 2.5 (ref 1.4–3.8)
T3 Uptake: 28 % (ref 22–35)
T4, Total: 8.9 ug/dL (ref 5.1–11.9)
TSH: 1.8 mIU/L

## 2020-01-03 LAB — COMPLETE METABOLIC PANEL WITH GFR
AG Ratio: 1.8 (calc) (ref 1.0–2.5)
ALT: 11 U/L (ref 6–29)
AST: 13 U/L (ref 10–35)
Albumin: 4.7 g/dL (ref 3.6–5.1)
Alkaline phosphatase (APISO): 68 U/L (ref 37–153)
BUN: 7 mg/dL (ref 7–25)
CO2: 30 mmol/L (ref 20–32)
Calcium: 10 mg/dL (ref 8.6–10.4)
Chloride: 103 mmol/L (ref 98–110)
Creat: 0.65 mg/dL (ref 0.50–1.05)
GFR, Est African American: 120 mL/min/{1.73_m2} (ref >=60)
GFR, Est Non African American: 104 mL/min/{1.73_m2} (ref >=60)
Globulin: 2.6 g/dL (calc) (ref 1.9–3.7)
Glucose, Bld: 90 mg/dL (ref 65–139)
Potassium: 4.3 mmol/L (ref 3.5–5.3)
Sodium: 141 mmol/L (ref 135–146)
Total Bilirubin: 0.6 mg/dL (ref 0.2–1.2)
Total Protein: 7.3 g/dL (ref 6.1–8.1)

## 2020-01-03 LAB — LIPID PANEL
Cholesterol: 208 mg/dL — ABNORMAL HIGH (ref <200)
HDL: 86 mg/dL (ref >=50)
LDL Cholesterol (Calc): 110 mg/dL (calc) — ABNORMAL HIGH
Non-HDL Cholesterol (Calc): 122 mg/dL (calc) (ref <130)
Total CHOL/HDL Ratio: 2.4 (calc) (ref <5.0)
Triglycerides: 42 mg/dL (ref <150)

## 2020-01-03 LAB — VITAMIN D 25 HYDROXY (VIT D DEFICIENCY, FRACTURES): Vit D, 25-Hydroxy: 35 ng/mL (ref 30–100)

## 2020-01-08 ENCOUNTER — Telehealth: Payer: Self-pay

## 2020-01-08 NOTE — Telephone Encounter (Signed)
I called patient and gave her the information and she voices understanding. She did not have any questions.

## 2020-01-08 NOTE — Telephone Encounter (Signed)
It is normal for white blood cells to fluctuate in general.  Our normal range here is 3.8-10.8 thousand/uL.  Your most recent level of 3.6 was just barely outside of the normal range and most likely related to recent Covid vaccination.  Viral infections and some vaccines can affect white blood cell counts by causing them to lower while bacterial infections usually raise them.  We can always recheck these in the next 3 to 6 months to make sure that you are at a normal range.  It looks like the past few years the normal range for you has been around 4.0 or just a little higher.  I hope this helps.  Please let me know if you have any other questions or concerns.

## 2020-01-08 NOTE — Telephone Encounter (Signed)
Pt reviewed her labs done on 01/02/2020 and saw her WBC's were at 3.6 thousand/uL. She looked at her lab history and saw the numbers below:  01/02/2020: 3.6 thousand/uL 01/2018: 4.6 thousand/uL 12/2017: 4.0 thousand/uL 12/2016: 4.0 thousand/uL 01/2015: 9.3 thousand/uL 08/2013: 7.8 thousand/uL  Pt is wanting to know why her WBC count keeps fluctuating.

## 2020-01-14 ENCOUNTER — Ambulatory Visit: Payer: 59 | Attending: Internal Medicine

## 2020-01-16 ENCOUNTER — Ambulatory Visit (INDEPENDENT_AMBULATORY_CARE_PROVIDER_SITE_OTHER): Payer: 59 | Admitting: Medical-Surgical

## 2020-01-16 ENCOUNTER — Other Ambulatory Visit: Payer: Self-pay

## 2020-01-16 VITALS — BP 146/86 | HR 77 | Temp 98.1°F

## 2020-01-16 DIAGNOSIS — I10 Essential (primary) hypertension: Secondary | ICD-10-CM

## 2020-01-16 NOTE — Progress Notes (Signed)
Pt presents today as a nurse visit for a blood pressure check.  Pt states she did not start taking the lisinopril-hctz 20-12.5 mg as instructed at her 01/02/2020 OV. Pt states she had plain lisinopril 20 mg at home and just kept taking that because she did not "want it to go to waste."  HA: No Dizziness/lightheadedness: No Fever: No BA: No Weakness/Fatigue: No  Sinus pain/pressure: No  Runny nose: No  ST: No  ShOB: No  CP: No  Palps: No Abd pain: No Dysuria: No  N/V/C/D: No    Vital Signs: At 1329: Blood Pressure: 158/101 Pulse: 81 SpO2: 98%  At 1339: Blood Pressure: 146/86 Pulse: 77 SpO2%: 99   I spoke with [name], DNP, APRN, FNP-BC who instructed me to advise the pt to start the lisinopril-hctz 20-12.5 mg and to follow up in 2 weeks as a nurse visit to recheck blood pressure on the new regimen.   When sharing this information with the pt she wanted to know if she could take hydroxyzine 10 mg. We did not have this listed on her medication record. Pt states she has been on this for the last 2-3 years for her anxiety and insomnia. She states she used to take it on a daily basis and then ran out but it was prescribed for her again when she was seen through an urgent care office 11/2019. She states that she only takes it on an as needed basis. She is wanting to know if it is okay for her to take it along with the lisinopril-hctz.   Her medication and allergy list were both reconciled and patient confirmed they are both completely up-to-date.   Patient also stated she had concerns regarding side effects. She states she read online that there are possible side effects with patients who have had a history of asthma, as well as possible side effects if the patient is allergic to PCN. According to her record, there is not a history of asthma, but she does break out in a rash when taking PCN. Pt wants to make sure that she will be okay taking the lisinopril-hctz.

## 2020-01-17 ENCOUNTER — Ambulatory Visit: Payer: 59 | Attending: Internal Medicine

## 2020-01-17 DIAGNOSIS — Z23 Encounter for immunization: Secondary | ICD-10-CM

## 2020-01-17 NOTE — Progress Notes (Signed)
Pt aware that per [name], DNP, APRN, FNP-BC:  It is fine to take the Hydroxyzine with the Lisinopril-HCTZ.   No further questions or concerns at this time.

## 2020-01-17 NOTE — Progress Notes (Signed)
   Covid-19 Vaccination Clinic  Name:  Maria Mccarty    MRN: [id number] DOB: [birth year]  01/17/2020  [name] was observed post Covid-19 immunization for 30 minutes based on pre-vaccination screening without incident. She was provided with Vaccine Information Sheet and instruction to access the V-Safe system.   [name] was instructed to call 911 with any severe reactions post vaccine: Marland Kitchen Difficulty breathing  . Swelling of face and throat  . A fast heartbeat  . A bad rash all over body  . Dizziness and weakness   Immunizations Administered    Name Date Dose VIS Date Route   Pfizer COVID-19 Vaccine 01/17/2020 10:15 AM 0.3 mL 09/14/2019 Intramuscular   Manufacturer: [place]   Lot: [id number]   [place]: [id number]

## 2020-01-17 NOTE — Progress Notes (Signed)
Per [name], DNP, APRN, FNP-BC:  HCTZ is usually well tolerated with few side effects. Also, a penicillin allergy is not a contraindication for HCTZ. Instead, if she had a sulfonamide or sulfa antibiotic drug allergy, we would need to reconsider.   Maria Mccarty

## 2020-01-17 NOTE — Progress Notes (Signed)
Pt aware of recommendations regarding lisinopril-hctz. Pt states she will start taking this Rx this afternoon.    Pt is wanting to know about the hydroxyzine. Please advise.

## 2020-04-01 ENCOUNTER — Other Ambulatory Visit: Payer: Self-pay | Admitting: Medical-Surgical

## 2020-04-01 DIAGNOSIS — Z1231 Encounter for screening mammogram for malignant neoplasm of breast: Secondary | ICD-10-CM

## 2020-04-18 ENCOUNTER — Ambulatory Visit
Admission: RE | Admit: 2020-04-18 | Discharge: 2020-04-18 | Disposition: A | Discharge status: Home / Self Care | Payer: 59 | Source: Ambulatory Visit | Attending: Medical-Surgical | Admitting: Medical-Surgical

## 2020-04-18 ENCOUNTER — Other Ambulatory Visit: Payer: Self-pay

## 2020-04-18 DIAGNOSIS — Z1231 Encounter for screening mammogram for malignant neoplasm of breast: Secondary | ICD-10-CM

## 2020-05-21 IMAGING — US US ABDOMEN COMPLETE
1 series · 14 of 25 positions shown · non-contrast
Comparison: Right upper quadrant ultrasound June 25, 2011

CLINICAL DATA: Several month history of right upper quadrant pain.
Patient reports light colored stools.

EXAM:
ABDOMEN ULTRASOUND COMPLETE

[Series 1: us abdomen complete · 0.17mm/px · 14 of 82 slices shown]
[im 1/82]
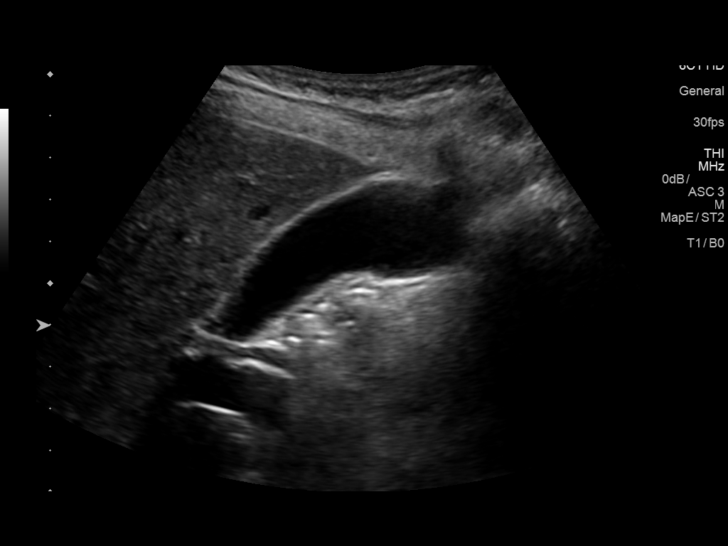
[im 7/82]
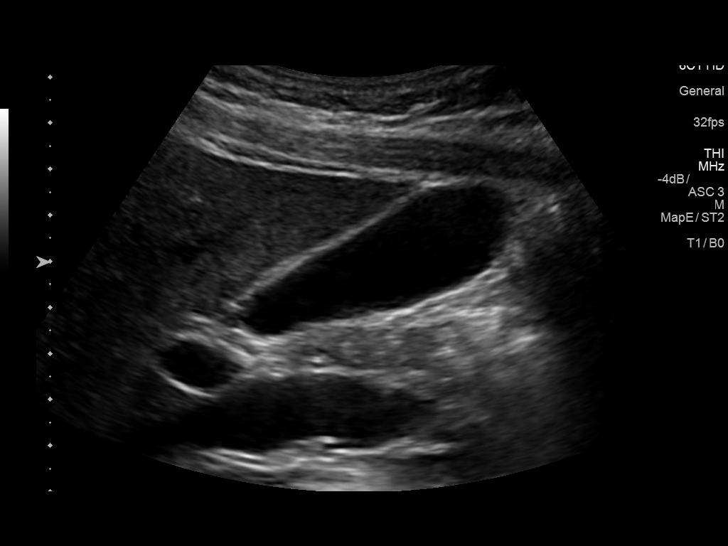
[im 14/82]
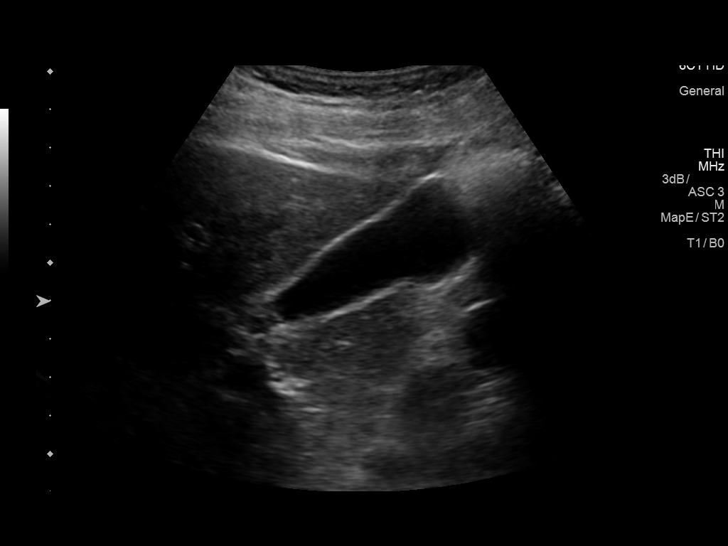
[im 21/82]
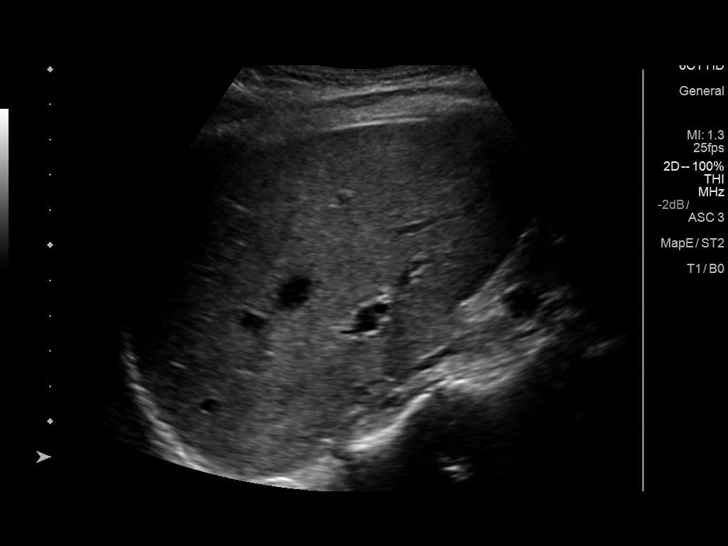
[im 28/82]
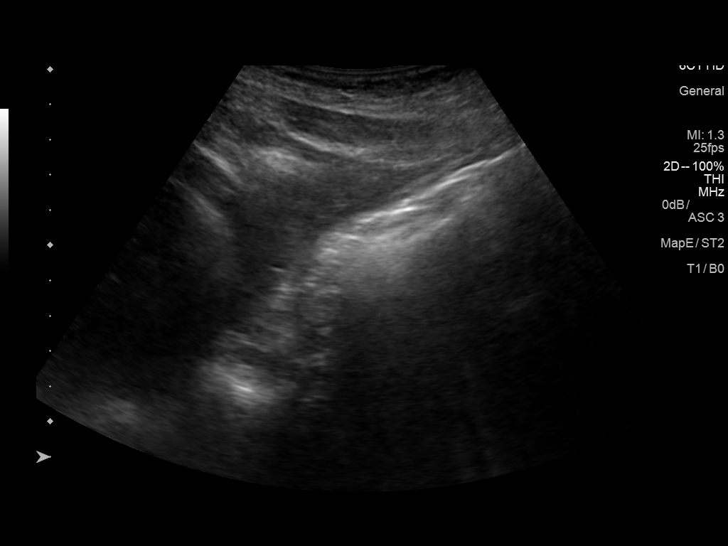
[im 31/82]
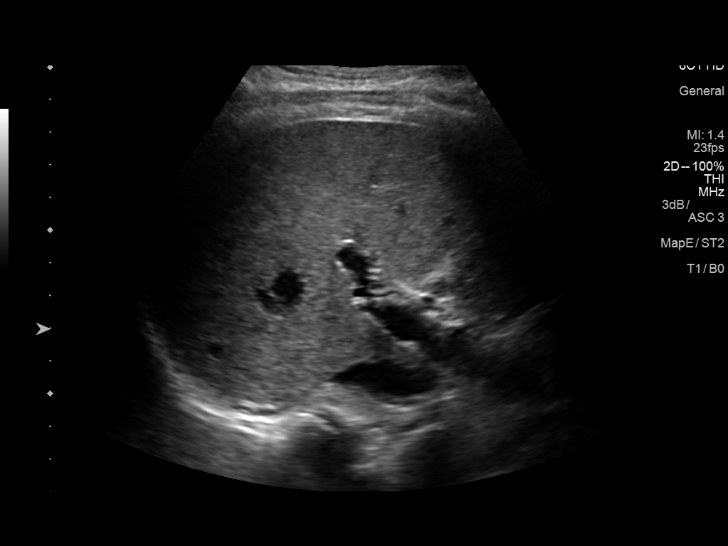
[im 38/82]
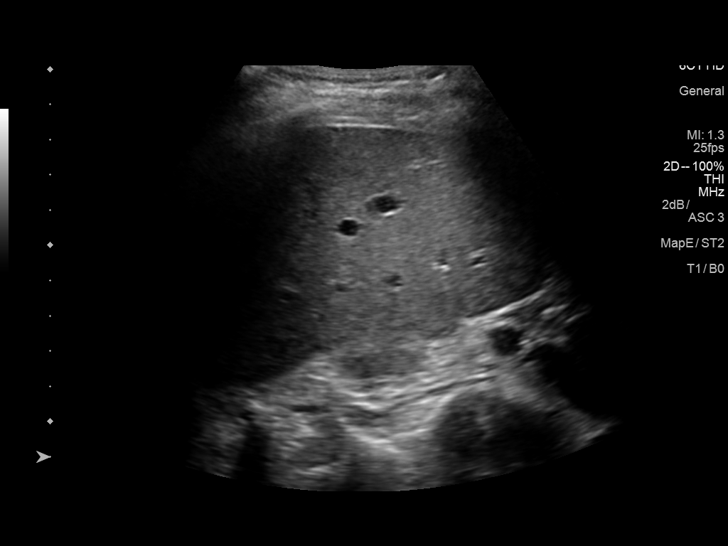
[im 44/82]
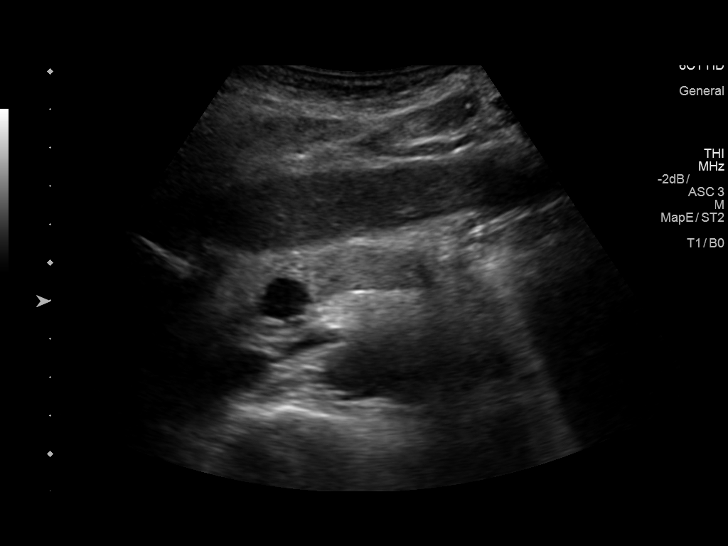
[im 51/82]
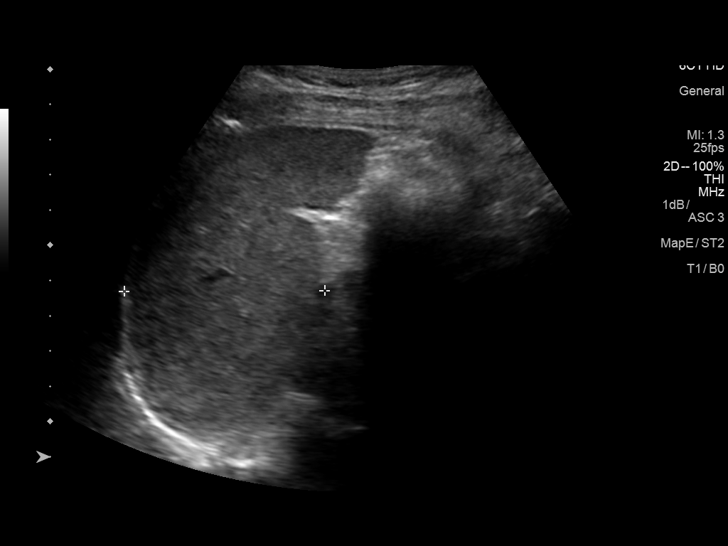
[im 55/82]
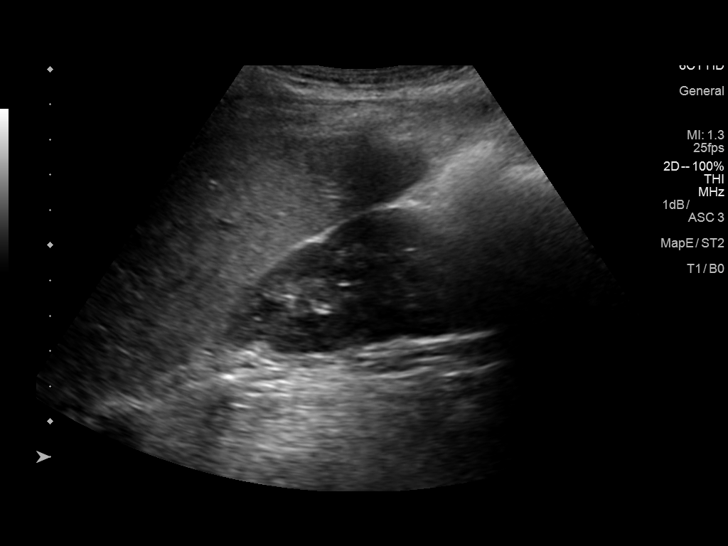
[im 61/82]
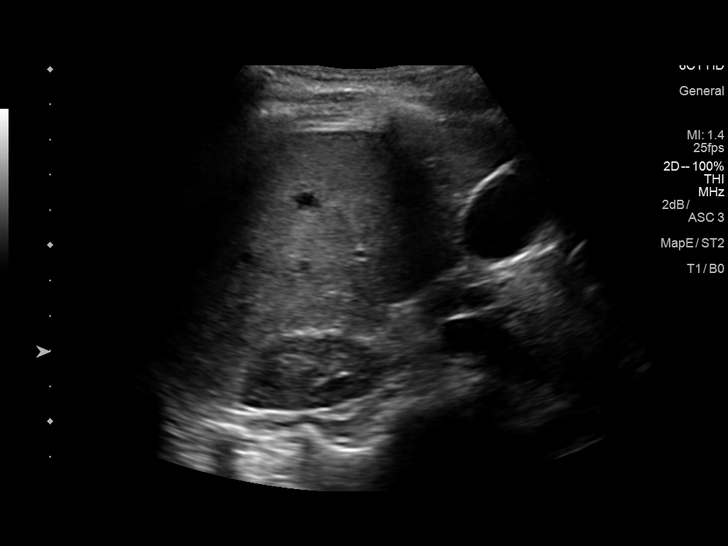
[im 68/82]
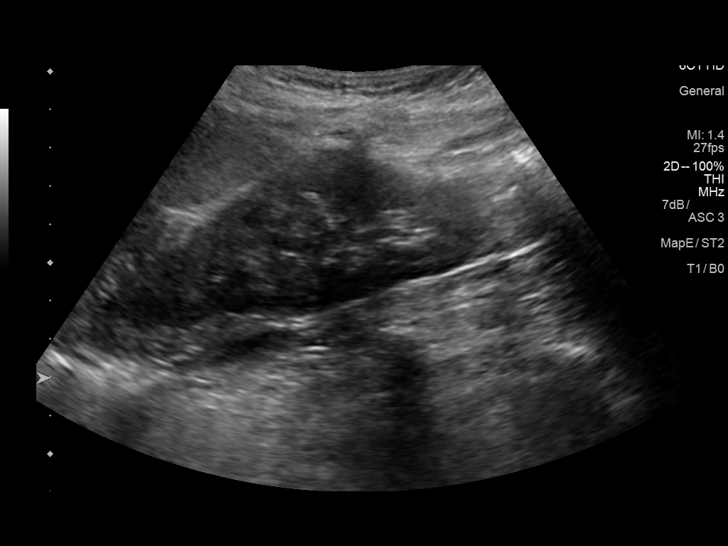
[im 75/82]
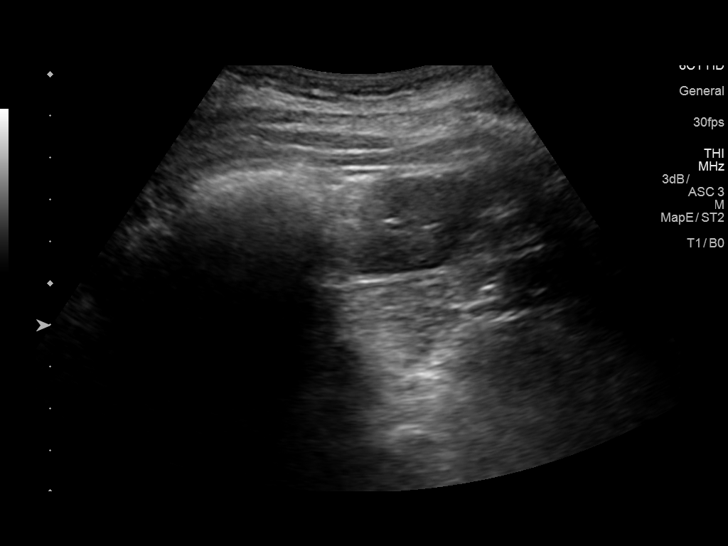
[im 82/82]
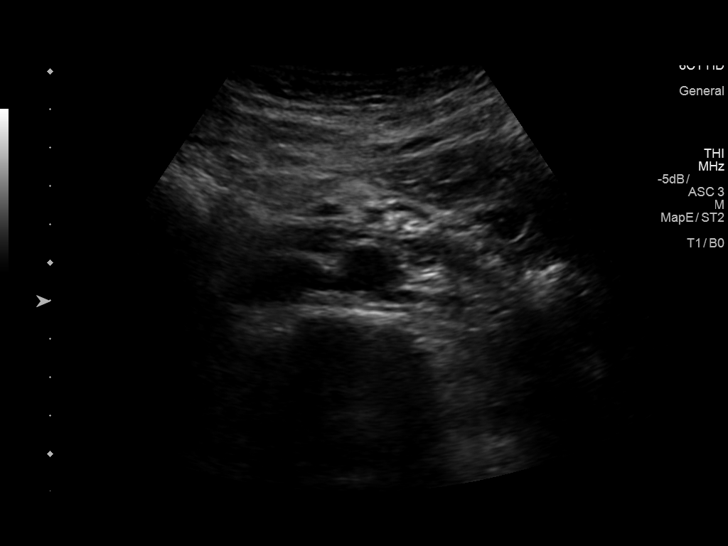

[14 of 25 positions shown; findings below may reference images not displayed]

FINDINGS: Gallbladder: No gallstones or wall thickening visualized. No
sonographic Murphy sign noted by sonographer.

Common bile duct: Diameter: 2.9 mm

Liver: No focal lesion identified. Within normal limits in
parenchymal echogenicity. Portal vein is patent on color Doppler
imaging with normal direction of blood flow towards the liver.

IVC: No abnormality visualized.

Pancreas: Visualized portion unremarkable.

Spleen: Size and appearance within normal limits.

Right Kidney: Length: 11.2 cm. Echogenicity within normal limits. No
mass or hydronephrosis visualized.

Left Kidney: Length: 10.8 cm. Echogenicity within normal limits. No
mass or hydronephrosis visualized.

Abdominal aorta: No aneurysm visualized.

Other findings: There is no ascites.
IMPRESSION: No gallstones or evidence of other hepatobiliary abnormality. No
acute intra-abdominal abnormality is demonstrated.

## 2020-06-19 ENCOUNTER — Ambulatory Visit (INDEPENDENT_AMBULATORY_CARE_PROVIDER_SITE_OTHER): Payer: 59 | Admitting: Medical-Surgical

## 2020-06-19 ENCOUNTER — Encounter: Payer: Self-pay | Admitting: Medical-Surgical

## 2020-06-19 VITALS — BP 143/89 | HR 71 | Temp 98.2°F | Ht 62.5 in | Wt 130.0 lb

## 2020-06-19 DIAGNOSIS — I10 Essential (primary) hypertension: Secondary | ICD-10-CM

## 2020-06-19 DIAGNOSIS — R102 Pelvic and perineal pain: Secondary | ICD-10-CM | POA: Diagnosis not present

## 2020-06-19 LAB — POCT URINALYSIS DIPSTICK
Bilirubin, UA: NEGATIVE
Glucose, UA: NEGATIVE
Ketones, UA: NEGATIVE
Leukocytes, UA: NEGATIVE
Nitrite, UA: NEGATIVE
Protein, UA: NEGATIVE
Spec Grav, UA: <=1.005 — AB (ref 1.010–1.025)
Urobilinogen, UA: 0.2 E.U./dL
pH, UA: 6 (ref 5.0–8.0)

## 2020-06-19 MED ORDER — AMITRIPTYLINE HCL 25 MG PO TABS: 25.0000 mg | ORAL_TABLET | Freq: Every day | ORAL | 0 refills | Status: DC

## 2020-06-19 NOTE — Progress Notes (Signed)
Subjective:    CC: Hypertension follow-up, pelvic pain  HPI: Pleasant 49 year old female presenting today for hypertension follow-up and to discuss complaints of continued pelvic pain.  Hypertension-taking lisinopril-hydrochlorothiazide 20-12.5 mg daily.  Tolerating the medication well without side effects.  Checking her blood pressure at home with reports of 120/70-80 most days.  Denies CP, SOB, palpitations, lower extremity edema, dizziness, headaches, or vision changes.  Pelvic pain-has been experiencing difficulty with pelvic/bladder pain for quite a few months now.  She was referred to [facility] urology but her visit with them did not meet her expectations.  She contacted a different urology office and has been able to see them.  They did run a urine specimen to check for infection but this was negative.  They spoke to her about foods that may irritate the bladder as well as measures to take to prevent bladder pain.  At that visit, they ordered a cystoscopy to be completed but this has not been scheduled even though it has been 2 weeks.  Yesterday she was able to get in to see her OB/GYN.  They did a full pelvic exam and did not find any explanation for her continued pelvic pain.  She was positive for a small amount of yeast on wet prep so they are treating her with terconazole cream daily.  They also provided her with a prescription for Pyridium to see if this will help calm down her bladder and help with the pain.  Today she is agitated and in quite a bit of discomfort.  She reports if something does not improve and she does not get some relief, she will be reporting to the emergency room.  Notes her urine had a very strong odor yesterday but her urinalysis at the OB/GYN office was clear.  She also has been having some burning with urination accompanied by frequency and urgency.  Denies fever, chills, visible blood in the urine, and bladder spasms.  I reviewed the past medical history, family  history, social history, surgical history, and allergies today and no changes were needed.  Please see the problem list section below in epic for further details.  Past Medical History: Past Medical History:  Diagnosis Date  . Acid reflux   . Hypertension   . Vaginal Pap smear, abnormal    Past Surgical History: Past Surgical History:  Procedure Laterality Date  . [place] STUDY N/A 04/24/2018   Procedure: [place] STUDY;  Surgeon: Mauri Pole, MD;  Location: [facility];  Service: Endoscopy;  Laterality: N/A;  . CERVICAL BIOPSY  W/ LOOP ELECTRODE EXCISION    . COLPOSCOPY    . ESOPHAGEAL MANOMETRY N/A 04/24/2018   Procedure: ESOPHAGEAL MANOMETRY (EM);  Surgeon: Mauri Pole, MD;  Location: [facility];  Service: Endoscopy;  Laterality: N/A;  . LEEP    . [place] IMPEDANCE STUDY  04/24/2018   Procedure: [place] IMPEDANCE STUDY;  Surgeon: Mauri Pole, MD;  Location: [facility];  Service: Endoscopy;;  . TONSILLECTOMY     Social History: Social History   Socioeconomic History  . Marital status: Single    Spouse name: Not on file  . Number of children: Not on file  . Years of education: Not on file  . Highest education level: Not on file  Occupational History  . Not on file  Tobacco Use  . Smoking status: Former Smoker    Quit date: 03/04/2016    Years since quitting: 4.2  . Smokeless tobacco: Never Used  Vaping Use  . Vaping Use: Never used  Substance and Sexual Activity  . Alcohol use: No  . Drug use: No  . Sexual activity: Not Currently    Birth control/protection: None  Other Topics Concern  . Not on file  Social History Narrative  . Not on file   Social Determinants of Health   Financial Resource Strain:   . Difficulty of Paying Living Expenses: Not on file  Food Insecurity:   . Worried About Charity fundraiser in the Last Year: Not on file  . Ran Out of Food in the Last Year: Not on file  Transportation Needs:   . Lack of Transportation  (Medical): Not on file  . Lack of Transportation (Non-Medical): Not on file  Physical Activity:   . Days of Exercise per Week: Not on file  . Minutes of Exercise per Session: Not on file  Stress:   . Feeling of Stress : Not on file  Social Connections:   . Frequency of Communication with Friends and Family: Not on file  . Frequency of Social Gatherings with Friends and Family: Not on file  . Attends Religious Services: Not on file  . Active Member of Clubs or Organizations: Not on file  . Attends Archivist Meetings: Not on file  . Marital Status: Not on file   Family History: Family History  Problem Relation Age of Onset  . Hypertension Brother   . Stroke Paternal Grandmother   . Hypertension Mother   . Hypertension Brother    Allergies: Allergies  Allergen Reactions  . Penicillins Rash   Medications: See med rec.  Review of Systems: See HPI for pertinent positives and negatives.   Objective:    General: Well Developed, well nourished, and in no acute distress.  Neuro: Alert and oriented x3.  HEENT: Normocephalic, atraumatic.  Skin: Warm and dry. Cardiac: Regular rate and rhythm, no murmurs rubs or gallops, no lower extremity edema.  Respiratory: Clear to auscultation bilaterally. Not using accessory muscles, speaking in full sentences. Abdomen: Soft, nondistended.  Bilateral lower quadrant and suprapubic tenderness.  Bowel sounds + x 4 quadrants. No HSM appreciated.  Impression and Recommendations:    1. Essential hypertension Blood pressure at goal although it is a bit elevated today.  She is in quite a bit of pain. Checking CBC, and CMP today.  Continue lisinopril-hydrochlorothiazide as ordered. - CBC - COMPLETE METABOLIC PANEL WITH GFR  2. Pelvic pain in female As she has had a clearance by OB/GYN for gynecological causes of her pelvic pain, suspect etiology is related to the bladder.  POCT urinalysis positive for trace lysed blood with a low specific  gravity today.  Negative for leukocytes and nitrites.  Sending specimen for urine microscopy as well as culture.  With her continued bladder pain despite no presence of infection, consider possible interstitial cystitis.  Recommend she contact her urology office for scheduling of the cystoscopy at the earliest possible date.  With her increased anxiety and significant bladder pain, trialing amitriptyline 25 mg at bedtime to see if this has a beneficial effect.  Discussed possible side effects of this medication, patient verbalized understanding and is agreeable to the plan. - POCT Urinalysis Dipstick - Urine Culture - Urine Microscopic  Return in about 1 week (around 06/26/2020) for bladder pain (ok to be virtual). ___________________________________________ Clearnce Sorrel, DNP, APRN, FNP-BC Primary [facility] and Sports Medicine [place]

## 2020-06-19 NOTE — Patient Instructions (Signed)
Start amitriptyline 25mg  nightly. This may make you sleepy and a little foggy in the morning. This will improve after the first few days.   Continue the cream and the medication the GYN gave you.  Drink plenty of water.  Work to get an appointment for the cystocopy.

## 2020-06-20 LAB — COMPLETE METABOLIC PANEL WITH GFR
AG Ratio: 2.1 (calc) (ref 1.0–2.5)
ALT: 11 U/L (ref 6–29)
AST: 14 U/L (ref 10–35)
Albumin: 4.7 g/dL (ref 3.6–5.1)
Alkaline phosphatase (APISO): 67 U/L (ref 37–153)
BUN: 10 mg/dL (ref 7–25)
CO2: 31 mmol/L (ref 20–32)
Calcium: 9.4 mg/dL (ref 8.6–10.4)
Chloride: 92 mmol/L — ABNORMAL LOW (ref 98–110)
Creat: 0.63 mg/dL (ref 0.50–1.05)
GFR, Est African American: 121 mL/min/{1.73_m2} (ref >=60)
GFR, Est Non African American: 105 mL/min/{1.73_m2} (ref >=60)
Globulin: 2.2 g/dL (calc) (ref 1.9–3.7)
Glucose, Bld: 102 mg/dL (ref 65–139)
Potassium: 3.6 mmol/L (ref 3.5–5.3)
Sodium: 130 mmol/L — ABNORMAL LOW (ref 135–146)
Total Bilirubin: 0.5 mg/dL (ref 0.2–1.2)
Total Protein: 6.9 g/dL (ref 6.1–8.1)

## 2020-06-20 LAB — CBC
HCT: 40.9 % (ref 35.0–45.0)
Hemoglobin: 14.3 g/dL (ref 11.7–15.5)
MCH: 30.4 pg (ref 27.0–33.0)
MCHC: 35 g/dL (ref 32.0–36.0)
MCV: 87 fL (ref 80.0–100.0)
MPV: 10.3 fL (ref 7.5–12.5)
Platelets: 289 10*3/uL (ref 140–400)
RBC: 4.7 10*6/uL (ref 3.80–5.10)
RDW: 12.2 % (ref 11.0–15.0)
WBC: 4.8 10*3/uL (ref 3.8–10.8)

## 2020-06-20 LAB — UNLABELED: Test Ordered On Req: 8563

## 2020-06-24 LAB — PAT ID TIQ DOC: Test Affected: 8563

## 2020-06-24 LAB — URINALYSIS, MICROSCOPIC ONLY
Bacteria, UA: NONE SEEN /HPF
Hyaline Cast: NONE SEEN /LPF
RBC / HPF: NONE SEEN /HPF (ref 0–2)
Squamous Epithelial / HPF: NONE SEEN /HPF (ref <=5)
WBC, UA: NONE SEEN /HPF (ref 0–5)

## 2020-06-24 LAB — URINE CULTURE
MICRO NUMBER:: 10962471
Result:: NO GROWTH
SPECIMEN QUALITY:: ADEQUATE

## 2020-06-26 ENCOUNTER — Telehealth (INDEPENDENT_AMBULATORY_CARE_PROVIDER_SITE_OTHER): Payer: 59 | Admitting: Medical-Surgical

## 2020-06-26 ENCOUNTER — Other Ambulatory Visit: Payer: Self-pay

## 2020-06-26 ENCOUNTER — Encounter: Payer: Self-pay | Admitting: Medical-Surgical

## 2020-06-26 VITALS — BP 140/90 | HR 72

## 2020-06-26 DIAGNOSIS — I10 Essential (primary) hypertension: Secondary | ICD-10-CM

## 2020-06-26 DIAGNOSIS — R102 Pelvic and perineal pain: Secondary | ICD-10-CM | POA: Diagnosis not present

## 2020-06-26 MED ORDER — PHENAZOPYRIDINE HCL 200 MG PO TABS: 200.0000 mg | ORAL_TABLET | Freq: Three times a day (TID) | ORAL | 1 refills | Status: DC | PRN

## 2020-06-26 MED ORDER — LISINOPRIL 10 MG PO TABS: 10.0000 mg | ORAL_TABLET | Freq: Every day | ORAL | 1 refills | Status: DC

## 2020-06-26 NOTE — Progress Notes (Signed)
Virtual Visit via Video Note  I connected with Maria Mccarty on 06/26/20 at 11:10 AM EDT by a video enabled telemedicine application and verified that I am speaking with the correct person using two identifiers.   I discussed the limitations of evaluation and management by telemedicine and the availability of in person appointments. The patient expressed understanding and agreed to proceed.  Patient location: home Provider locations: office  Subjective:    CC: bladder pain  HPI: Pleasant 49 year old female presenting via MyChart video visit for follow-up regarding pelvic pain.  1 week ago, she was evaluated by myself for continued pelvic pain.  At that time her urine was clear and a culture has since shown no growth.  She was prescribed amitriptyline 25 mg nightly to help with her bladder pain while awaiting a cystoscopy.  She admits that she has not started the amitriptyline due to anxiety with new medications.  She was given Pyridium by the OB/GYN 1 day prior to our appointment.  She has been taking this approximately once daily.  Although the pain was bad enough to prompt her going to the emergency room over the weekend, the last 2 days she notes that she is had much improved pelvic/bladder pain.  She has contacted the urology office several times regarding scheduling her cystoscopy but reports that they had informed her that they are quite backed up and it may be a while before she is able to get scheduled.  This has her quite anxious due to the unknown etiology of her bladder pain.  She has been checking her blood pressure at home with consistent readings of 140s/90s over the past week.  This is also worrying her, quite possibly exacerbating her blood pressure elevation.  She is taking lisinopril-HCTZ 20-12.5 mg daily. Denies chest pain, SOB, palpitations, dizziness, headaches.   Past medical history, Surgical history, Family history not pertinant except as noted below, Social history,  Allergies, and medications have been entered into the medical record, reviewed, and corrections made.   Review of Systems: See HPI for pertinent positives and negatives.   Objective:    General: Speaking clearly in complete sentences without any shortness of breath.  Alert and oriented x3.  Normal judgment. No apparent acute distress.  Impression and Recommendations:    1. Pelvic pain in female Some improvement with the use of Pyridium noted.  Ultimately, I feel that she needs a cystoscopy to have a full understanding of what is causing her pain.  I will check with our office staff to see if there is anything we can do to speed along scheduling of her cystoscopy but she may end up having to wait for the earliest opportunity.  If we can find someone who can do the cystoscopy faster, I will certainly enter a referral to get her connected with them.  Continue Pyridium as needed.  Refill sent to the pharmacy to make sure she does not run out of this.  If her pain returns and is severe, strongly encouraged trying amitriptyline as ordered.  Patient verbalized understanding and is agreeable to the plan.  2. Essential hypertension With no speedy resolution to her pelvic pain and the anxiety of not knowing the exact cause, sending in an extra dose of lisinopril 10 mg to combine with her current lisinopril-HCTZ 20-12.5 mg daily.  Advised to monitor blood pressures at home at least daily with a goal of 130/80 or less.  I will recheck her in 1 to 2 weeks to see  how her blood pressures are doing with the additional dose of medication.   Return if symptoms worsen or fail to improve.  20 minutes of non face-to-face time was provided during this encounter.  I discussed the assessment and treatment plan with the patient. The patient was provided an opportunity to ask questions and all were answered. The patient agreed with the plan and demonstrated an understanding of the instructions.   The patient was advised  to call back or seek an in-person evaluation if the symptoms worsen or if the condition fails to improve as anticipated.  Clearnce Sorrel, DNP, APRN, FNP-BC [place] [facility]

## 2020-07-03 ENCOUNTER — Encounter: Payer: Self-pay | Admitting: Medical-Surgical

## 2020-07-04 LAB — HM COLONOSCOPY

## 2020-09-01 ENCOUNTER — Other Ambulatory Visit: Payer: Self-pay

## 2020-09-01 MED ORDER — LISINOPRIL 10 MG PO TABS
10.0000 mg | ORAL_TABLET | Freq: Every day | ORAL | 0 refills | Status: DC
Start: 2020-09-01 — End: 2020-09-11

## 2020-09-11 ENCOUNTER — Ambulatory Visit (INDEPENDENT_AMBULATORY_CARE_PROVIDER_SITE_OTHER): Payer: 59 | Admitting: Medical-Surgical

## 2020-09-11 ENCOUNTER — Encounter: Payer: Self-pay | Admitting: Medical-Surgical

## 2020-09-11 VITALS — BP 161/93 | HR 71 | Temp 98.0°F | Ht 62.5 in | Wt 135.5 lb

## 2020-09-11 DIAGNOSIS — I10 Essential (primary) hypertension: Secondary | ICD-10-CM

## 2020-09-11 DIAGNOSIS — F411 Generalized anxiety disorder: Secondary | ICD-10-CM

## 2020-09-11 MED ORDER — LISINOPRIL 10 MG PO TABS: 10.0000 mg | ORAL_TABLET | Freq: Every day | ORAL | 1 refills | Status: DC

## 2020-09-11 MED ORDER — HYDROXYZINE HCL 10 MG PO TABS: 10.0000 mg | ORAL_TABLET | Freq: Three times a day (TID) | ORAL | 1 refills | Status: DC | PRN

## 2020-09-11 MED ORDER — ESCITALOPRAM OXALATE 5 MG PO TABS: 5.0000 mg | ORAL_TABLET | Freq: Every day | ORAL | 3 refills | Status: DC

## 2020-09-11 NOTE — Progress Notes (Signed)
Subjective:    CC: HTN follow up  HPI: Pleasant 49 year old female presenting today for follow up for hypertension. Notes that she checks her BP at home with readings usually in the 130s but this morning it was 140/92 after drinking coffee. Reports BP is high at nearly every doctors appointment. Taking Lisinopril 10mg  every AM and Lisinopril-HCTZ 20-12.5mg  every evening. Wonders if anxiety may be contributing to her elevated blood pressure. Has noted her anxiety levels have crept up since she entered menopause an often feels tense for no obvious reason. Worries a lot about things, especially COVID. Takes Hydroxyzine 10mg  at night to help reduce anxiety and get to sleep. Feels this works well but doesn't take it every night to prevent getting used to it and it not working anymore. Is interested in starting a medication to help control her anxiety and hopefully help her blood pressure. Denies CP, SOB, palpitations, lower extremity edema, dizziness, headaches, or vision changes. Denies SI/HI.  I reviewed the past medical history, family history, social history, surgical history, and allergies today and no changes were needed.  Please see the problem list section below in epic for further details.  Past Medical History: Past Medical History:  Diagnosis Date  . Acid reflux   . Hypertension   . Vaginal Pap smear, abnormal    Past Surgical History: Past Surgical History:  Procedure Laterality Date  . [place] STUDY N/A 04/24/2018   Procedure: [place] STUDY;  Surgeon: Mauri Pole, MD;  [facility];  Service: Endoscopy;  Laterality: N/A;  . CERVICAL BIOPSY  W/ LOOP ELECTRODE EXCISION    . COLPOSCOPY    . ESOPHAGEAL MANOMETRY N/A 04/24/2018   Procedure: ESOPHAGEAL MANOMETRY (EM);  Surgeon: Mauri Pole, MD;  [facility];  Service: Endoscopy;  Laterality: N/A;  . LEEP    . [place] IMPEDANCE STUDY  04/24/2018   Procedure: [place] IMPEDANCE STUDY;  Surgeon: Mauri Pole, MD;  [facility];  Service: Endoscopy;;  . TONSILLECTOMY     Social History: Social History   Socioeconomic History  . Marital status: Single    Spouse name: Not on file  . Number of children: Not on file  . Years of education: Not on file  . Highest education level: Not on file  Occupational History  . Not on file  Tobacco Use  . Smoking status: Former Smoker    Quit date: 03/04/2016    Years since quitting: 4.5  . Smokeless tobacco: Never Used  Vaping Use  . Vaping Use: Never used  Substance and Sexual Activity  . Alcohol use: No  . Drug use: No  . Sexual activity: Not Currently    Birth control/protection: None  Other Topics Concern  . Not on file  Social History Narrative  . Not on file   Social Determinants of Health   Financial Resource Strain: Not on file  Food Insecurity: Not on file  Transportation Needs: Not on file  Physical Activity: Not on file  Stress: Not on file  Social Connections: Not on file   Family History: Family History  Problem Relation Age of Onset  . Hypertension Brother   . Stroke Paternal Grandmother   . Hypertension Mother   . Hypertension Brother    Allergies: Allergies  Allergen Reactions  . Penicillins Rash   Medications: See med rec.  Review of Systems: See HPI for pertinent positives and negatives.   Objective:    General: Well Developed, well  nourished, and in no acute distress.  Neuro: Alert and oriented x3.  HEENT: Normocephalic, atraumatic.  Skin: Warm and dry. Cardiac: Regular rate and rhythm, no murmurs rubs or gallops, no lower extremity edema.  Respiratory: Clear to auscultation bilaterally. Not using accessory muscles, speaking in full sentences.  Impression and Recommendations:    1. Essential hypertension Continue current regimen but recommend taking Lisinopril 10mg  with Lisinopril-HCTZ 20-12.5mg  together every morning. Continue to monitor home blood pressures with goal of 130/80 or  less. If consistently higher, please return for re-evaluation.  2. Anxiety, generalized Starting Lexapro 5mg  daily. Hesitant to take a higher dose until she knows she tolerates it without a lot of side effects. Ok to continue Hydroxyzine TID as needed.  Return in about 4 weeks (around 10/09/2020) for mood follow up. ___________________________________________ Clearnce Sorrel, DNP, APRN, FNP-BC Primary [facility] and [place]

## 2020-10-09 ENCOUNTER — Ambulatory Visit: Payer: 59 | Admitting: Medical-Surgical

## 2020-10-16 ENCOUNTER — Ambulatory Visit (INDEPENDENT_AMBULATORY_CARE_PROVIDER_SITE_OTHER): Payer: 59 | Admitting: Medical-Surgical

## 2020-10-16 ENCOUNTER — Encounter: Payer: Self-pay | Admitting: Medical-Surgical

## 2020-10-16 ENCOUNTER — Other Ambulatory Visit: Payer: Self-pay

## 2020-10-16 VITALS — BP 155/80 | HR 78 | Ht 62.5 in | Wt 135.7 lb

## 2020-10-16 DIAGNOSIS — I10 Essential (primary) hypertension: Secondary | ICD-10-CM | POA: Diagnosis not present

## 2020-10-16 DIAGNOSIS — F411 Generalized anxiety disorder: Secondary | ICD-10-CM | POA: Diagnosis not present

## 2020-10-16 NOTE — Progress Notes (Signed)
Subjective:    CC: mood follow up  HPI: Pleasant 50 year old female presenting for the following:   Mood- started the Lexapro 5mg  daily but noted that her BP was high for several days after. This made her nervous so she stopped it and decided to discuss the issue with me before restarting it. Admits that was around the holidays and there has been some family stress. Wonders if the BP elevation was related to the anxiety from that or from taking a new medication. Denies SI/HI.   HTN-checking BP with readings 120-130/80 at home. Taking Lisinopril 30mg  daily with HCTZ 12.5mg  daily. Tolerating well without side effects.   I reviewed the past medical history, family history, social history, surgical history, and allergies today and no changes were needed.  Please see the problem list section below in epic for further details.  Past Medical History: Past Medical History:  Diagnosis Date  . Acid reflux   . Hypertension   . Vaginal Pap smear, abnormal    Past Surgical History: Past Surgical History:  Procedure Laterality Date  . [place] STUDY N/A 04/24/2018   Procedure: [place] STUDY;  Surgeon: Mauri Pole, MD;  [facility];  Service: Endoscopy;  Laterality: N/A;  . CERVICAL BIOPSY  W/ LOOP ELECTRODE EXCISION    . COLPOSCOPY    . ESOPHAGEAL MANOMETRY N/A 04/24/2018   Procedure: ESOPHAGEAL MANOMETRY (EM);  Surgeon: Mauri Pole, MD;  [facility];  Service: Endoscopy;  Laterality: N/A;  . LEEP    . [place] IMPEDANCE STUDY  04/24/2018   Procedure: [place] IMPEDANCE STUDY;  Surgeon: Mauri Pole, MD;  [facility];  Service: Endoscopy;;  . TONSILLECTOMY     Social History: Social History   Socioeconomic History  . Marital status: Single    Spouse name: Not on file  . Number of children: Not on file  . Years of education: Not on file  . Highest education level: Not on file  Occupational History  . Not on file  Tobacco Use  . Smoking  status: Former Smoker    Quit date: 03/04/2016    Years since quitting: 4.6  . Smokeless tobacco: Never Used  Vaping Use  . Vaping Use: Never used  Substance and Sexual Activity  . Alcohol use: No  . Drug use: No  . Sexual activity: Not Currently    Birth control/protection: None  Other Topics Concern  . Not on file  Social History Narrative  . Not on file   Social Determinants of Health   Financial Resource Strain: Not on file  Food Insecurity: Not on file  Transportation Needs: Not on file  Physical Activity: Not on file  Stress: Not on file  Social Connections: Not on file   Family History: Family History  Problem Relation Age of Onset  . Hypertension Brother   . Stroke Paternal Grandmother   . Hypertension Mother   . Hypertension Brother    Allergies: Allergies  Allergen Reactions  . Penicillins Rash   Medications: See med rec.  Review of Systems: See HPI for pertinent positives and negatives.   Depression screen [facility] 2/9 10/16/2020 01/02/2020 11/03/2016  Decreased Interest 1 0 0  Down, Depressed, Hopeless 0 0 0  PHQ - 2 Score 1 0 0  Altered sleeping 3 3 2   Tired, decreased energy 2 2 0  Change in appetite 0 0 0  Feeling bad or failure about yourself  1 0 0  Trouble concentrating  1 0 0  Moving slowly or fidgety/restless 0 0 0  Suicidal thoughts 0 0 0  PHQ-9 Score 8 5 2   Difficult doing work/chores Not difficult at all Not difficult at all -   GAD 7 : Generalized Anxiety Score 10/16/2020 01/02/2020 11/03/2016  Nervous, Anxious, on Edge 2 0 0  Control/stop worrying 0 0 0  Worry too much - different things 2 0 0  Trouble relaxing 2 0 0  Restless 0 0 0  Easily annoyed or irritable 0 0 0  Afraid - awful might happen 1 0 0  Total GAD 7 Score 7 0 0  Anxiety Difficulty Not difficult at all Not difficult at all -   Objective:    General: Well Developed, well nourished, and in no acute distress.  Neuro: Alert and oriented x3.  HEENT: Normocephalic, atraumatic.   Skin: Warm and dry. Cardiac: Regular rate and rhythm, no murmurs rubs or gallops, no lower extremity edema.  Respiratory: Clear to auscultation bilaterally. Not using accessory muscles, speaking in full sentences.   Impression and Recommendations:    1. Anxiety, generalized Referring to behavioral health for counseling. Restart Lexapro 5mg  daily. Monitor BP and if elevated for several days, stop it. If unable to tolerate Lexapro, consider starting Amitriptyline 25mg  nightly that was prescribed a few months ago.  - Ambulatory referral to [place]  2. Essential hypertension Home readings at goal. Continue current Lisinopril-HCTZ as prescribed.   Return in about 2 weeks (around 10/30/2020) for mood follow up (virtual is ok). ___________________________________________ Clearnce Sorrel, DNP, APRN, FNP-BC Primary Care and Sports Medicine [place]

## 2020-11-13 ENCOUNTER — Other Ambulatory Visit: Payer: Self-pay | Admitting: Medical-Surgical

## 2020-12-29 ENCOUNTER — Ambulatory Visit (INDEPENDENT_AMBULATORY_CARE_PROVIDER_SITE_OTHER): Payer: 59 | Admitting: Psychology

## 2020-12-29 DIAGNOSIS — F4322 Adjustment disorder with anxiety: Secondary | ICD-10-CM | POA: Diagnosis not present

## 2021-01-13 ENCOUNTER — Other Ambulatory Visit: Payer: Self-pay

## 2021-01-13 ENCOUNTER — Ambulatory Visit (INDEPENDENT_AMBULATORY_CARE_PROVIDER_SITE_OTHER): Payer: 59 | Admitting: Ophthalmology

## 2021-01-13 ENCOUNTER — Encounter (INDEPENDENT_AMBULATORY_CARE_PROVIDER_SITE_OTHER): Payer: Self-pay | Admitting: Ophthalmology

## 2021-01-13 DIAGNOSIS — H3581 Retinal edema: Secondary | ICD-10-CM

## 2021-01-13 DIAGNOSIS — D3132 Benign neoplasm of left choroid: Secondary | ICD-10-CM | POA: Diagnosis not present

## 2021-01-13 DIAGNOSIS — H35033 Hypertensive retinopathy, bilateral: Secondary | ICD-10-CM

## 2021-01-13 DIAGNOSIS — I10 Essential (primary) hypertension: Secondary | ICD-10-CM | POA: Diagnosis not present

## 2021-01-13 NOTE — Progress Notes (Signed)
[facility] Note  01/13/2021     CHIEF COMPLAINT Patient presents for Retina Evaluation   HISTORY OF PRESENT ILLNESS: Maria Mccarty is a 50 y.o. female who presents to the clinic today for:   HPI    Retina Evaluation    In left eye.  This started 1 day ago.  I, the attending physician,  performed the HPI with the patient and updated documentation appropriately.          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 Pt was seen by Dr. Katy Fitch yesterday. First eye exam in a long time she reports. Vision wise patient doesn't have hx of wearing glasses or needing them. Just recently began wearing readers. She reports vision is okay besides seeing up close. No major vision changes noticed recently. 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        Last edited by Bernarda Caffey, MD on 01/13/2021 12:41 PM. (History)    pt is here on the referral of Shirleen Schirmer, PA-C for concern of choroidal nevus OS, pt states she saw him yesterday for a routine eye exam, pt has no eye hx, no hx of cancer, pt takes medication for hypertension and anxiety, pt states she has had a recent colonoscopy and had 3 cancerous polyps removed, she states she had a pre-cancerous mole removed off the back of her leg a few months ago  Referring physician: [facility] [address] [place],  [place] [address]  HISTORICAL INFORMATION:   Selected notes from the MEDICAL RECORD NUMBER Referred by Shirleen Schirmer, PA-C for choroidal nevus OS LEE:  Ocular Hx- PMH-    CURRENT MEDICATIONS: No current outpatient medications on file. (Ophthalmic Drugs)   No current facility-administered medications for this visit. (Ophthalmic Drugs)   Current Outpatient Medications (Other)  Medication Sig  . Cholecalciferol (VITAMIN D) 125 MCG (5000 UT) CAPS Take 1  capsule by mouth daily.  Marland Kitchen escitalopram (LEXAPRO) 5 MG tablet Take 1 tablet (5 mg total) by mouth daily.  . hydrOXYzine (ATARAX/VISTARIL) 10 MG tablet Take 1 tablet (10 mg total) by mouth 3 (three) times daily as needed for anxiety. **PATIENT NEEDS OFFICE VISIT FOR ADDITIONAL REFILLS**  . lisinopril (ZESTRIL) 10 MG tablet Take 1 tablet (10 mg total) by mouth daily.  Marland Kitchen lisinopril-hydrochlorothiazide (ZESTORETIC) 20-12.5 MG tablet Take 1 tablet by mouth daily.  . phenazopyridine (PYRIDIUM) 200 MG tablet Take 1 tablet (200 mg total) by mouth 3 (three) times daily as needed.  Marland Kitchen terconazole (TERAZOL 7) 0.4 % vaginal cream Place 1 applicator vaginally at bedtime.   No current facility-administered medications for this visit. (Other)      REVIEW OF SYSTEMS: ROS    Positive for: Cardiovascular, Psychiatric   Last edited by Kingsley Spittle, COT on 01/13/2021  8:28 AM. (History)       ALLERGIES Allergies  Allergen Reactions  . Penicillins Rash    PAST MEDICAL HISTORY Past Medical History:  Diagnosis Date  . Acid reflux   . Hypertension   . Vaginal Pap smear, abnormal    Past Surgical History:  Procedure Laterality Date  . [place] STUDY N/A 04/24/2018   Procedure: [place] STUDY;  Surgeon: Mauri Pole, MD;  Location: [facility];  Service: Endoscopy;  Laterality: N/A;  . CERVICAL BIOPSY  W/ LOOP ELECTRODE EXCISION    . COLPOSCOPY    . ESOPHAGEAL MANOMETRY N/A 04/24/2018   Procedure: ESOPHAGEAL MANOMETRY (EM);  Surgeon: Mauri Pole, MD;  Location: [facility];  Service: Endoscopy;  Laterality: N/A;  . LEEP    . [place] IMPEDANCE STUDY  04/24/2018   Procedure: [place] IMPEDANCE STUDY;  Surgeon: Mauri Pole, MD;  Location: [facility];  Service: Endoscopy;;  . TONSILLECTOMY      FAMILY HISTORY Family History  Problem Relation Age of Onset  . Hypertension Brother   . Stroke Paternal Grandmother   . Hypertension Mother   . Hypertension Brother     SOCIAL  HISTORY Social History   Tobacco Use  . Smoking status: Former Smoker    Quit date: 03/04/2016    Years since quitting: 4.8  . Smokeless tobacco: Never Used  Vaping Use  . Vaping Use: Never used  Substance Use Topics  . Alcohol use: No  . Drug use: No         OPHTHALMIC EXAM:  Base Eye Exam    Visual Acuity (Snellen - Linear)      Right Left   Dist [place] 20/25 -2 20/30   Dist ph [place] 20/20 -2 20/25 -2       Tonometry (Tonopen, 8:42 AM)      Right Left   Pressure 15 11       Pupils      Dark Light Shape React APD   Right 3 2 Round Brisk None   Left 3 2 Round Brisk None       Visual Fields (Counting fingers)      Left Right    Full Full       Extraocular Movement      Right Left    Full, Ortho Full, Ortho       Neuro/Psych    Oriented x3: Yes   Mood/Affect: Normal       Dilation    Both eyes: 1.0% Mydriacyl, 2.5% Phenylephrine @ 8:44 AM        Slit Lamp and Fundus Exam    Slit Lamp Exam      Right Left   Lids/Lashes mild MGD mild MGD   Conjunctiva/Sclera White and quiet White and quiet   Cornea mild arcus, mild tear film debris mild arcus, mild tear film debris, 1-2+ Punctate epithelial erosions   Anterior Chamber Deep and quiet Deep and quiet   Iris Round and dilated Round and dilated   Lens Clear Clear   Vitreous Vitreous syneresis Vitreous syneresis       Fundus Exam      Right Left   Disc Pink and Sharp Pink and Sharp   C/D Ratio 0.2 0.2   Macula Flat, Good foveal reflex, mild RPE mottling and clumping, No heme or edema Flat, Good foveal reflex, mild RPE mottling and clumping, No heme or edema   Vessels mild attenuation mild attenuation   Periphery Attached, mild peripheral cystoid degeneration, No heme  Attached; 3x3DD irregular, pigmented choroidal lesion with surrounding drusen and RPE changes -- ill defined borders, mildly elevated, no SRF or orange pigment, located at 0800 equator, No heme , No RT/RD          IMAGING AND PROCEDURES   Imaging and Procedures for 01/13/2021  OCT, Retina - OU - Both Eyes       Right Eye Quality was good. Central Foveal Thickness: 291. Progression has no prior data. Findings include normal foveal  contour, no IRF, no SRF, vitreomacular adhesion .   Left Eye Quality was good. Central Foveal Thickness: 290. Progression has no prior data. Findings include normal foveal contour, no IRF, no SRF, vitreomacular adhesion .   Notes *Images captured and stored on drive  Diagnosis / Impression:  NFP; no IRF/SRF OU  Clinical management:  See below  Abbreviations: NFP - Normal foveal profile. CME - cystoid macular edema. PED - pigment epithelial detachment. IRF - intraretinal fluid. SRF - subretinal fluid. EZ - ellipsoid zone. ERM - epiretinal membrane. ORA - outer retinal atrophy. ORT - outer retinal tubulation. SRHM - subretinal hyper-reflective material. IRHM - intraretinal hyper-reflective material        Color Fundus Photography Optos - OU - Both Eyes       Right Eye Progression has no prior data. Disc findings include normal observations. Macula : normal observations. Vessels : attenuated, tortuous vessels. Periphery : normal observations.   Left Eye Progression has no prior data. Disc findings include normal observations. Macula : normal observations. Vessels : attenuated, tortuous vessels. Periphery : RPE abnormality (choroidal lesion with overlying drusen and pigment changes IN quad).   Notes **Images stored on drive**  Impression: OD: normal study OS: choroidal nevus -- choroidal lesion with overlying drusen and pigment changes IN quad                ASSESSMENT/PLAN:    ICD-10-CM   1. Choroidal nevus of left eye  D31.32 Color Fundus Photography Optos - OU - Both Eyes  2. Retinal edema  H35.81 OCT, Retina - OU - Both Eyes  3. Essential hypertension  I10   4. Hypertensive retinopathy of both eyes  H35.033     1. Choroidal Nevus, OS  - irregular pigmented lesion  at 0800 equator -- ~3x3DD  - no visual symptoms, SRF or orange pigment  - +drusen  - thickness < 22mm  - discussed findings, prognosis  - will refer to Dr. Daralene Milch at [facility] for further evaluation and management  - f/u 3-4 months, DFE, OCT  2. No retinal edema on exam or OCT  3,4. Hypertensive retinopathy OU - discussed importance of tight BP control - monitor  Ophthalmic Meds Ordered this visit:  No orders of the defined types were placed in this encounter.      Return for f/u 3-4 months, choroidal nevus OS, DFE, OCT.  There are no Patient Instructions on file for this visit.   Explained the diagnoses, plan, and follow up with the patient and they expressed understanding.  Patient expressed understanding of the importance of proper follow up care.   This document serves as a record of services personally performed by Gardiner Sleeper, MD, PhD. It was created on their behalf by San Jetty. Owens Shark, OA an ophthalmic technician. The creation of this record is the provider's dictation and/or activities during the visit.    Electronically signed by: San Jetty. Marguerita Merles 04.12.2022 12:45 PM   Gardiner Sleeper, M.D., Ph.D. Diseases & Surgery of the Retina and Vitreous [facility]  I have reviewed the above documentation for accuracy and completeness, and I agree with the above. Gardiner Sleeper, M.D., Ph.D. 01/13/21 12:45 PM  Abbreviations: M myopia (nearsighted); A astigmatism; H hyperopia (farsighted); P presbyopia; Mrx spectacle prescription;  CTL contact lenses; OD right eye; OS left eye; OU both eyes  XT exotropia; ET esotropia; PEK punctate epithelial keratitis; PEE punctate epithelial erosions; DES dry eye syndrome; MGD meibomian  gland dysfunction; ATs artificial tears; PFAT's preservative free artificial tears; [place] nuclear sclerotic cataract; PSC posterior subcapsular cataract; ERM epi-retinal membrane; PVD posterior vitreous detachment; RD retinal detachment; DM  diabetes mellitus; DR diabetic retinopathy; NPDR non-proliferative diabetic retinopathy; PDR proliferative diabetic retinopathy; CSME clinically significant macular edema; DME diabetic macular edema; dbh dot blot hemorrhages; CWS cotton wool spot; POAG primary open angle glaucoma; C/D cup-to-disc ratio; HVF humphrey visual field; GVF goldmann visual field; OCT optical coherence tomography; IOP intraocular pressure; BRVO Branch retinal vein occlusion; CRVO central retinal vein occlusion; CRAO central retinal artery occlusion; BRAO branch retinal artery occlusion; RT retinal tear; SB scleral buckle; PPV pars plana vitrectomy; VH Vitreous hemorrhage; PRP panretinal laser photocoagulation; IVK intravitreal kenalog; VMT vitreomacular traction; MH Macular hole;  NVD neovascularization of the disc; NVE neovascularization elsewhere; AREDS age related eye disease study; ARMD age related macular degeneration; POAG primary open angle glaucoma; EBMD epithelial/anterior basement membrane dystrophy; ACIOL anterior chamber intraocular lens; IOL intraocular lens; PCIOL posterior chamber intraocular lens; Phaco/IOL phacoemulsification with intraocular lens placement; [place] photorefractive keratectomy; LASIK laser assisted in situ keratomileusis; HTN hypertension; DM diabetes mellitus; COPD chronic obstructive pulmonary disease

## 2021-01-29 ENCOUNTER — Ambulatory Visit (INDEPENDENT_AMBULATORY_CARE_PROVIDER_SITE_OTHER): Payer: 59 | Admitting: Psychology

## 2021-01-29 DIAGNOSIS — F4322 Adjustment disorder with anxiety: Secondary | ICD-10-CM | POA: Diagnosis not present

## 2021-02-25 ENCOUNTER — Other Ambulatory Visit: Payer: Self-pay | Admitting: Medical-Surgical

## 2021-02-25 DIAGNOSIS — I10 Essential (primary) hypertension: Secondary | ICD-10-CM

## 2021-02-26 ENCOUNTER — Ambulatory Visit (INDEPENDENT_AMBULATORY_CARE_PROVIDER_SITE_OTHER): Payer: 59 | Admitting: Psychology

## 2021-02-26 DIAGNOSIS — F4322 Adjustment disorder with anxiety: Secondary | ICD-10-CM

## 2021-03-11 ENCOUNTER — Ambulatory Visit (INDEPENDENT_AMBULATORY_CARE_PROVIDER_SITE_OTHER): Payer: 59 | Admitting: Psychology

## 2021-03-11 DIAGNOSIS — F4322 Adjustment disorder with anxiety: Secondary | ICD-10-CM

## 2021-03-25 ENCOUNTER — Ambulatory Visit (INDEPENDENT_AMBULATORY_CARE_PROVIDER_SITE_OTHER): Payer: 59 | Admitting: Medical-Surgical

## 2021-03-25 ENCOUNTER — Encounter: Payer: Self-pay | Admitting: Medical-Surgical

## 2021-03-25 ENCOUNTER — Other Ambulatory Visit: Payer: Self-pay

## 2021-03-25 VITALS — BP 146/79 | HR 68 | Temp 97.9°F | Ht 62.5 in | Wt 130.1 lb

## 2021-03-25 DIAGNOSIS — E782 Mixed hyperlipidemia: Secondary | ICD-10-CM | POA: Diagnosis not present

## 2021-03-25 DIAGNOSIS — R300 Dysuria: Secondary | ICD-10-CM | POA: Diagnosis not present

## 2021-03-25 DIAGNOSIS — I10 Essential (primary) hypertension: Secondary | ICD-10-CM

## 2021-03-25 DIAGNOSIS — F411 Generalized anxiety disorder: Secondary | ICD-10-CM | POA: Diagnosis not present

## 2021-03-25 LAB — POCT URINALYSIS DIP (CLINITEK)
Bilirubin, UA: NEGATIVE
Glucose, UA: NEGATIVE mg/dL
Ketones, POC UA: NEGATIVE mg/dL
Leukocytes, UA: NEGATIVE
Nitrite, UA: NEGATIVE
POC PROTEIN,UA: NEGATIVE
Spec Grav, UA: 1.01 (ref 1.010–1.025)
Urobilinogen, UA: 0.2 E.U./dL
pH, UA: 5 (ref 5.0–8.0)

## 2021-03-25 LAB — COMPLETE METABOLIC PANEL WITH GFR
AG Ratio: 1.7 (calc) (ref 1.0–2.5)
ALT: 17 U/L (ref 6–29)
AST: 18 U/L (ref 10–35)
Albumin: 4.8 g/dL (ref 3.6–5.1)
Alkaline phosphatase (APISO): 68 U/L (ref 37–153)
BUN: 12 mg/dL (ref 7–25)
CO2: 28 mmol/L (ref 20–32)
Calcium: 9.8 mg/dL (ref 8.6–10.4)
Chloride: 96 mmol/L — ABNORMAL LOW (ref 98–110)
Creat: 0.64 mg/dL (ref 0.50–1.05)
GFR, Est African American: 120 mL/min/{1.73_m2} (ref >=60)
GFR, Est Non African American: 103 mL/min/{1.73_m2} (ref >=60)
Globulin: 2.8 g/dL (calc) (ref 1.9–3.7)
Glucose, Bld: 83 mg/dL (ref 65–99)
Potassium: 4.2 mmol/L (ref 3.5–5.3)
Sodium: 136 mmol/L (ref 135–146)
Total Bilirubin: 0.5 mg/dL (ref 0.2–1.2)
Total Protein: 7.6 g/dL (ref 6.1–8.1)

## 2021-03-25 LAB — CBC WITH DIFFERENTIAL/PLATELET
Absolute Monocytes: 371 cells/uL (ref 200–950)
Basophils Absolute: 19 cells/uL (ref 0–200)
Basophils Relative: 0.4 %
Eosinophils Absolute: 19 cells/uL (ref 15–500)
Eosinophils Relative: 0.4 %
HCT: 44.7 % (ref 35.0–45.0)
Hemoglobin: 15.4 g/dL (ref 11.7–15.5)
Lymphs Abs: 1072 cells/uL (ref 850–3900)
MCH: 29.7 pg (ref 27.0–33.0)
MCHC: 34.5 g/dL (ref 32.0–36.0)
MCV: 86.1 fL (ref 80.0–100.0)
MPV: 11.5 fL (ref 7.5–12.5)
Monocytes Relative: 7.9 %
Neutro Abs: 3220 cells/uL (ref 1500–7800)
Neutrophils Relative %: 68.5 %
Platelets: 159 10*3/uL (ref 140–400)
RBC: 5.19 10*6/uL — ABNORMAL HIGH (ref 3.80–5.10)
RDW: 11.9 % (ref 11.0–15.0)
Total Lymphocyte: 22.8 %
WBC: 4.7 10*3/uL (ref 3.8–10.8)

## 2021-03-25 LAB — LIPID PANEL
Cholesterol: 238 mg/dL — ABNORMAL HIGH (ref <200)
HDL: 89 mg/dL (ref >=50)
LDL Cholesterol (Calc): 135 mg/dL (calc) — ABNORMAL HIGH
Non-HDL Cholesterol (Calc): 149 mg/dL (calc) — ABNORMAL HIGH (ref <130)
Total CHOL/HDL Ratio: 2.7 (calc) (ref <5.0)
Triglycerides: 47 mg/dL (ref <150)

## 2021-03-25 MED ORDER — HYDROXYZINE HCL 10 MG PO TABS: 10.0000 mg | ORAL_TABLET | Freq: Three times a day (TID) | ORAL | 0 refills | Status: DC | PRN

## 2021-03-25 MED ORDER — LISINOPRIL-HYDROCHLOROTHIAZIDE 20-12.5 MG PO TABS: 1.0000 | ORAL_TABLET | Freq: Every day | ORAL | 0 refills | Status: DC

## 2021-03-25 MED ORDER — LISINOPRIL 10 MG PO TABS: 10.0000 mg | ORAL_TABLET | Freq: Every day | ORAL | 1 refills | Status: DC

## 2021-03-25 NOTE — Progress Notes (Signed)
Subjective:    CC: mood/HTN follow up  HPI: Pleasant 50 year old female presenting today for mood and hypertension follow-up.  Mood-she stopped taking Lexapro 5 mg daily.  She does have hydroxyzine at home that she uses as needed but has not taken this in quite a while.  Feels that her mood is doing very well and she is not having any issues to address at this point.  Denies SI/HI.  Hypertension-she is taking lisinopril 30 mg with 12.5 mg of hydrochlorothiazide.  Tolerating both medications well without side effects.  She does monitor her blood pressure at home with readings in the 120s-130s/80s.  Notes her readings are always elevated at the doctor's office because she has a bit of whitecoat hypertension.  She has a history of frequent UTIs and urinary type symptoms.  She has noticed lately that she has had a bit of dysuria and would like to have her urine checked today to make sure there is no infection there.  She does have Pyridium at home to use as needed and finds it helpful.  I reviewed the past medical history, family history, social history, surgical history, and allergies today and no changes were needed.  Please see the problem list section below in epic for further details.  Past Medical History: Past Medical History:  Diagnosis Date   Acid reflux    Hypertension    Vaginal Pap smear, abnormal    Past Surgical History: Past Surgical History:  Procedure Laterality Date   56 HOUR [place] STUDY N/A 04/24/2018   Procedure: 24 HOUR [place] STUDY;  Surgeon: Mauri Pole, MD;  Location: [facility];  Service: Endoscopy;  Laterality: N/A;   CERVICAL BIOPSY  W/ LOOP ELECTRODE EXCISION     COLPOSCOPY     ESOPHAGEAL MANOMETRY N/A 04/24/2018   Procedure: ESOPHAGEAL MANOMETRY (EM);  Surgeon: Mauri Pole, MD;  Location: [facility];  Service: Endoscopy;  Laterality: N/A;   LEEP     [place] IMPEDANCE STUDY  04/24/2018   Procedure: [place] IMPEDANCE STUDY;  Surgeon: Mauri Pole, MD;   Location: [facility];  Service: Endoscopy;;   TONSILLECTOMY     Social History: Social History   Socioeconomic History   Marital status: Single    Spouse name: Not on file   Number of children: Not on file   Years of education: Not on file   Highest education level: Not on file  Occupational History   Not on file  Tobacco Use   Smoking status: Former    Pack years: 0.00    Types: Cigarettes    Quit date: 03/04/2016    Years since quitting: 5.0   Smokeless tobacco: Never  Vaping Use   Vaping Use: Never used  Substance and Sexual Activity   Alcohol use: No   Drug use: No   Sexual activity: Not Currently    Birth control/protection: None  Other Topics Concern   Not on file  Social History Narrative   Not on file   Social Determinants of Health   Financial Resource Strain: Not on file  Food Insecurity: Not on file  Transportation Needs: Not on file  Physical Activity: Not on file  Stress: Not on file  Social Connections: Not on file   Family History: Family History  Problem Relation Age of Onset   Hypertension Brother    Stroke Paternal Grandmother    Hypertension Mother    Hypertension Brother    Allergies: Allergies  Allergen Reactions   Penicillins Rash  Medications: See med rec.  Review of Systems: See HPI for pertinent positives and negatives.   Objective:    General: Well Developed, well nourished, and in no acute distress.  Neuro: Alert and oriented x 3.  HEENT: Normocephalic, atraumatic.  Skin: Warm and dry. Cardiac: Regular rate and rhythm, no murmurs rubs or gallops, no lower extremity edema.  Respiratory: Clear to auscultation bilaterally. Not using accessory muscles, speaking in full sentences.   Impression and Recommendations:    1. Essential hypertension Blood pressure slightly elevated here today but home readings are at goal.  Continue lisinopril-HCTZ 20-12.5 mg along with lisinopril 10 mg daily.  Checking CBC with differential and  CMP today. - lisinopril (ZESTRIL) 10 MG tablet; Take 1 tablet (10 mg total) by mouth daily.  Dispense: 90 tablet; Refill: 1 - lisinopril-hydrochlorothiazide (ZESTORETIC) 20-12.5 MG tablet; Take 1 tablet by mouth daily. **PATIENT NEEDS OFFICE VISIT FOR ADDITIONAL REFILLS**  Dispense: 30 tablet; Refill: 0 - CBC with Differential/Platelet - COMPLETE METABOLIC PANEL WITH GFR  2. Anxiety, generalized Continue hydroxyzine 10 mg 3 times daily as needed.  If anxiety worsens, may benefit from resuming Lexapro.  She will reach out to me if she decides this is necessary. - hydrOXYzine (ATARAX/VISTARIL) 10 MG tablet; Take 1 tablet (10 mg total) by mouth 3 (three) times daily as needed for anxiety. **PATIENT NEEDS OFFICE VISIT FOR ADDITIONAL REFILLS**  Dispense: 90 tablet; Refill: 0  3. Mixed hyperlipidemia Checking lipid panel today. - Lipid panel  4. Dysuria POCT urinalysis positive for trace lysed blood but no other abnormalities.  Sending for culture.  Okay to use Pyridium 3 times daily as needed for dysuria.  Reviewed the importance of staying well-hydrated to prevent urinary issues. - POCT URINALYSIS DIP (CLINITEK) - Urine Culture  Return in about 6 months (around 09/24/2021) for HTN/mood follow up. ___________________________________________ Clearnce Sorrel, DNP, APRN, FNP-BC [facility] [place]

## 2021-03-26 LAB — URINE CULTURE
MICRO NUMBER:: 12038514
Result:: NO GROWTH
SPECIMEN QUALITY:: ADEQUATE

## 2021-03-27 ENCOUNTER — Encounter (INDEPENDENT_AMBULATORY_CARE_PROVIDER_SITE_OTHER): Payer: 59 | Admitting: Ophthalmology

## 2021-03-27 ENCOUNTER — Encounter (INDEPENDENT_AMBULATORY_CARE_PROVIDER_SITE_OTHER): Payer: Self-pay

## 2021-04-03 ENCOUNTER — Ambulatory Visit (INDEPENDENT_AMBULATORY_CARE_PROVIDER_SITE_OTHER): Payer: 59 | Admitting: Psychology

## 2021-04-03 DIAGNOSIS — F4323 Adjustment disorder with mixed anxiety and depressed mood: Secondary | ICD-10-CM | POA: Diagnosis not present

## 2021-04-13 ENCOUNTER — Other Ambulatory Visit: Payer: Self-pay | Admitting: Medical-Surgical

## 2021-04-13 DIAGNOSIS — I10 Essential (primary) hypertension: Secondary | ICD-10-CM

## 2021-05-04 ENCOUNTER — Ambulatory Visit (INDEPENDENT_AMBULATORY_CARE_PROVIDER_SITE_OTHER): Payer: 59 | Admitting: Psychology

## 2021-05-04 DIAGNOSIS — F4322 Adjustment disorder with anxiety: Secondary | ICD-10-CM

## 2021-05-28 ENCOUNTER — Ambulatory Visit (INDEPENDENT_AMBULATORY_CARE_PROVIDER_SITE_OTHER): Payer: 59 | Admitting: Psychology

## 2021-05-28 DIAGNOSIS — F4322 Adjustment disorder with anxiety: Secondary | ICD-10-CM | POA: Diagnosis not present

## 2021-06-01 ENCOUNTER — Telehealth: Payer: Self-pay | Admitting: Medical-Surgical

## 2021-06-01 NOTE — Telephone Encounter (Signed)
PT called stating she is having a billing issue regarding her DOS 03-25-21. [employer] has made the patient go back and forth with our billing department and back to the insurance multiple times. They started off saying "Caryl Asp is out of network". Then told the patient, "The NPI is incorrect". And are now saying, "The tax ID is incorrect". The patient is trying to get this figured out before it's sent to collections. I spoke with Deirdre our Environmental education officer and she is now looking into the issue.

## 2021-06-22 ENCOUNTER — Other Ambulatory Visit: Payer: Self-pay | Admitting: Medical-Surgical

## 2021-06-22 DIAGNOSIS — Z1231 Encounter for screening mammogram for malignant neoplasm of breast: Secondary | ICD-10-CM

## 2021-06-25 ENCOUNTER — Ambulatory Visit (INDEPENDENT_AMBULATORY_CARE_PROVIDER_SITE_OTHER): Payer: 59 | Admitting: Psychology

## 2021-06-25 DIAGNOSIS — F4322 Adjustment disorder with anxiety: Secondary | ICD-10-CM

## 2021-07-01 ENCOUNTER — Other Ambulatory Visit: Payer: Self-pay

## 2021-07-01 ENCOUNTER — Ambulatory Visit
Admission: RE | Admit: 2021-07-01 | Discharge: 2021-07-01 | Disposition: A | Discharge status: Home / Self Care | Payer: 59 | Source: Ambulatory Visit | Attending: Medical-Surgical | Admitting: Medical-Surgical

## 2021-07-01 DIAGNOSIS — Z1231 Encounter for screening mammogram for malignant neoplasm of breast: Secondary | ICD-10-CM

## 2021-07-08 ENCOUNTER — Other Ambulatory Visit: Payer: Self-pay

## 2021-07-08 ENCOUNTER — Encounter: Payer: Self-pay | Admitting: Medical-Surgical

## 2021-07-08 ENCOUNTER — Ambulatory Visit (INDEPENDENT_AMBULATORY_CARE_PROVIDER_SITE_OTHER): Payer: 59 | Admitting: Medical-Surgical

## 2021-07-08 VITALS — BP 146/89 | HR 65 | Resp 20 | Ht 62.5 in | Wt 130.3 lb

## 2021-07-08 DIAGNOSIS — Z2820 Immunization not carried out because of patient decision for unspecified reason: Secondary | ICD-10-CM | POA: Diagnosis not present

## 2021-07-08 DIAGNOSIS — K219 Gastro-esophageal reflux disease without esophagitis: Secondary | ICD-10-CM

## 2021-07-08 DIAGNOSIS — I361 Nonrheumatic tricuspid (valve) insufficiency: Secondary | ICD-10-CM | POA: Diagnosis not present

## 2021-07-08 MED ORDER — NEXIUM 40 MG PO CPDR: 40.0000 mg | DELAYED_RELEASE_CAPSULE | Freq: Every day | ORAL | 3 refills | Status: DC

## 2021-07-08 NOTE — Progress Notes (Signed)
  HPI with pertinent ROS:   CC: Abdominal pain  HPI: Pleasant 50 year old female presenting with complaints of epigastric abdominal pain for the last month. Has had this in the past and was treated successfully with nexium but has been unable to find the brand name of it over the counter. Complains of increased belching, gassiness, hiccups, and bloating. Some intermittent nausea but no vomiting. No diarrhea, melena, or hematochezia. Chronic intermittent constipation but nothing outside of normal for her. Very stable diet without a lot of reflux triggers. Has picked up several herbal supplements lately but has not started them yet. Feels that her symptoms coincided when she picked up her last refill on her blood pressure medication.   Has noted some palpitations lately. Not sure if this is related to her current GI issues or if it is related to her known heart valve issue. Is due for a repeat echocardiogram and would like to have that done.   I reviewed the past medical history, family history, social history, surgical history, and allergies today and no changes were needed.  Please see the problem list section below in epic for further details.   Physical exam:   General: Well Developed, well nourished, and in no acute distress.  Neuro: Alert and oriented x3.  HEENT: Normocephalic, atraumatic.  Skin: Warm and dry. Cardiac: Regular rate and rhythm, no murmurs rubs or gallops, no lower extremity edema.  Respiratory: Clear to auscultation bilaterally. Not using accessory muscles, speaking in full sentences. Abdomen: Soft, epigastric and LLQ tenderness, nondistended. Bowel sounds + x 4 quadrants. No HSM appreciated.  Impression and Recommendations:    1. Gastroesophageal reflux disease, unspecified whether esophagitis present Restart nexium brand name per patient request. Reviewed foods that are known triggers. Possibly some increased stool burden exacerbating symptoms. Recommend a daily  probiotic or stool softener to help with constipation.   2. Nonrheumatic tricuspid valve regurgitation Repeat echo.  - ECHOCARDIOGRAM COMPLETE; Future  3. Vaccine refused by patient Patient aware of risks vs benefits of vaccination and still declined today.   Return if symptoms worsen or fail to improve. ___________________________________________ Clearnce Sorrel, DNP, APRN, FNP-BC [facility] [place]

## 2021-07-08 NOTE — Patient Instructions (Signed)

## 2021-07-23 ENCOUNTER — Ambulatory Visit (INDEPENDENT_AMBULATORY_CARE_PROVIDER_SITE_OTHER): Payer: 59 | Admitting: Psychology

## 2021-07-23 ENCOUNTER — Other Ambulatory Visit: Payer: Self-pay

## 2021-07-23 DIAGNOSIS — F4322 Adjustment disorder with anxiety: Secondary | ICD-10-CM | POA: Diagnosis not present

## 2021-07-27 ENCOUNTER — Ambulatory Visit (HOSPITAL_COMMUNITY): Payer: 59 | Attending: Cardiovascular Disease

## 2021-07-27 ENCOUNTER — Other Ambulatory Visit: Payer: Self-pay

## 2021-07-27 DIAGNOSIS — I361 Nonrheumatic tricuspid (valve) insufficiency: Secondary | ICD-10-CM | POA: Insufficient documentation

## 2021-07-27 LAB — ECHOCARDIOGRAM COMPLETE
Area-P 1/2: 3.46 cm2
S' Lateral: 2.4 cm

## 2021-11-22 ENCOUNTER — Other Ambulatory Visit: Payer: Self-pay

## 2021-11-22 ENCOUNTER — Encounter (HOSPITAL_BASED_OUTPATIENT_CLINIC_OR_DEPARTMENT_OTHER): Payer: Self-pay | Admitting: *Deleted

## 2021-11-22 ENCOUNTER — Emergency Department (HOSPITAL_BASED_OUTPATIENT_CLINIC_OR_DEPARTMENT_OTHER)
Admission: EM | Admit: 2021-11-22 | Discharge: 2021-11-22 | Disposition: A | Discharge status: Home / Self Care | Payer: Commercial Managed Care - HMO | Attending: Emergency Medicine | Admitting: Emergency Medicine

## 2021-11-22 DIAGNOSIS — E871 Hypo-osmolality and hyponatremia: Secondary | ICD-10-CM | POA: Insufficient documentation

## 2021-11-22 DIAGNOSIS — I10 Essential (primary) hypertension: Secondary | ICD-10-CM | POA: Insufficient documentation

## 2021-11-22 DIAGNOSIS — Z79899 Other long term (current) drug therapy: Secondary | ICD-10-CM | POA: Insufficient documentation

## 2021-11-22 DIAGNOSIS — M79662 Pain in left lower leg: Secondary | ICD-10-CM | POA: Diagnosis present

## 2021-11-22 LAB — BASIC METABOLIC PANEL
Anion gap: 7 (ref 5–15)
BUN: 10 mg/dL (ref 6–20)
CO2: 29 mmol/L (ref 22–32)
Calcium: 9.2 mg/dL (ref 8.9–10.3)
Chloride: 92 mmol/L — ABNORMAL LOW (ref 98–111)
Creatinine, Ser: 0.59 mg/dL (ref 0.44–1.00)
GFR, Estimated: >60 mL/min (ref >60)
Glucose, Bld: 97 mg/dL (ref 70–99)
Potassium: 3.6 mmol/L (ref 3.5–5.1)
Sodium: 128 mmol/L — ABNORMAL LOW (ref 135–145)

## 2021-11-22 NOTE — ED Triage Notes (Signed)
Yesterday noted she noted a round discolored area on medial aspect of lt leg at mid tibial area, states she has intermittent "charlie horse". Not having any pain at side, but left leg feels tingling.

## 2021-11-22 NOTE — ED Notes (Signed)
No obvious redness or swelling noted by this RN. Has full plantar and dorsal flexion of left foot, negative Bevelyn Buckles' sign

## 2021-11-22 NOTE — ED Provider Notes (Signed)
[facility] EMERGENCY DEPARTMENT Provider Note   CSN: [id number] Arrival date & time: 11/22/21  1130     History  Chief Complaint  Patient presents with   Leg Pain    Maria Mccarty is a 51 y.o. female.   Leg Pain Patient presents with pain in her left lower extremity.  States she has had some cramping in the left calf.  Comes and goes.  Also states there is a swelling on the medial aspect of the lower leg.  Noticed yesterday.  Worried that it could be a clot in the leg.  No chest pain or trouble breathing.  No trauma to the area.  Does not smoke.   Past Medical History:  Diagnosis Date   Acid reflux    Hypertension    Vaginal Pap smear, abnormal     Home Medications Prior to Admission medications   Medication Sig Start Date End Date Taking? Authorizing Provider  Cholecalciferol (VITAMIN D) 125 MCG (5000 UT) CAPS Take 1 capsule by mouth daily.    [provider]  hydrOXYzine (ATARAX/VISTARIL) 10 MG tablet Take 1 tablet (10 mg total) by mouth 3 (three) times daily as needed for anxiety. **PATIENT NEEDS OFFICE VISIT FOR ADDITIONAL REFILLS** 03/25/21   [name], NP  lisinopril (ZESTRIL) 10 MG tablet Take 1 tablet (10 mg total) by mouth daily. 03/25/21   [name], NP  lisinopril-hydrochlorothiazide (ZESTORETIC) 20-12.5 MG tablet Take 1 tablet by mouth daily. 04/13/21   [name], NP  NEXIUM 40 MG capsule Take 1 capsule (40 mg total) by mouth daily. 07/08/21   [name], NP  phenazopyridine (PYRIDIUM) 200 MG tablet Take 1 tablet (200 mg total) by mouth 3 (three) times daily as needed. 06/26/20   [name], NP      Allergies    Penicillins    Review of Systems   Review of Systems  Respiratory:  Negative for shortness of breath.   Genitourinary:  Negative for flank pain.  Skin:  Negative for wound.  Neurological:  Negative for weakness.   Physical Exam Updated Vital Signs BP (!) 182/94 (BP Location: Right Arm)    Pulse 80    Temp 97.8 F  (36.6 C) (Oral)    Resp 16    Ht 5\' 3"  (1.6 m)    Wt 56.7 kg    LMP 12/05/2017 (Exact Date)    SpO2 100%    BMI 22.14 kg/m  Physical Exam Vitals and nursing note reviewed.  Musculoskeletal:     Comments: No tenderness over calf.  Good flexion extension at the ankle.  Skin:    Comments: Bump under the skin left medial lower leg.  Approximately half centimeter.  No fluctuance.  No erythema.  Neurological:     Mental Status: She is alert.    ED Results / Procedures / Treatments   Labs (all labs ordered are listed, but only abnormal results are displayed) Labs Reviewed  BASIC METABOLIC PANEL - Abnormal; Notable for the following components:      Result Value   Sodium 128 (*)    Chloride 92 (*)    All other components within normal limits    EKG None  Radiology No results found.  Procedures Procedures    Medications Ordered in ED Medications - No data to display  ED Course/ Medical Decision Making/ A&P  Medical Decision Making Amount and/or Complexity of Data Reviewed Labs: ordered. Decision-making details documented in ED Course.   Patient presents with pain in left lower leg.  States there is a bump medially.  There is a bump with mild tenderness.  Doubt DVT.  Potentially could be a superficial thrombophlebitis but unlikely in this position right over the bone.  Does not appear attached to the bone either.  Does not appear infectious.  Mild cramping at times but not witnessed here.  Mild hyponatremia.  We will follow as an outpatient.  States she is had a before.  Will encourage sodium intake as an outpatient.  Will discharge home and can follow-up with PCP.  Do not feel as if we need to duplex to evaluate for DVT at this time.  No tenderness over Achilles doubt Achilles pathology.  Doubt bony technology pathology and do not feel as if I need an x-ray.        Final Clinical Impression(s) / ED Diagnoses Final diagnoses:  Hyponatremia     Rx / DC Orders ED Discharge Orders     None         Davonna Belling, MD 11/22/21 [phone]

## 2021-11-23 ENCOUNTER — Telehealth: Payer: Self-pay | Admitting: Medical-Surgical

## 2021-11-23 ENCOUNTER — Telehealth: Payer: Self-pay

## 2021-11-23 ENCOUNTER — Encounter: Payer: Self-pay | Admitting: Medical-Surgical

## 2021-11-23 DIAGNOSIS — I10 Essential (primary) hypertension: Secondary | ICD-10-CM

## 2021-11-23 NOTE — Telephone Encounter (Signed)
Transition Care Management Follow-up Telephone Call Date of discharge and from where: 11/22/2021 from [facility] How have you been since you were released from the hospital? Patient stated that she is feeling better and did not have any questions at this time.  Any questions or concerns? No  Items Reviewed: Did the pt receive and understand the discharge instructions provided? Yes  Medications obtained and verified? Yes  Other? No  Any new allergies since your discharge? No  Dietary orders reviewed? No Do you have support at home? Yes   Functional Questionnaire: (I = Independent and D = Dependent) ADLs: I  Bathing/Dressing- I  Meal Prep- I  Eating- I  Maintaining continence- I  Transferring/Ambulation- I  Managing Meds- I   Follow up appointments reviewed:  PCP Hospital f/u appt confirmed? No  Recommended follow up with PCP.  [facility] f/u appt confirmed? No   Are transportation arrangements needed? No  If their condition worsens, is the pt aware to call PCP or go to the Emergency Dept.? No Was the patient provided with contact information for the PCP's office or ED? Yes Was to pt encouraged to call back with questions or concerns? Yes

## 2021-11-23 NOTE — Telephone Encounter (Signed)
Patient is scheduled for an appt on 4/4 but stated she will be out of her Lisinopril and Hydrochlorothiazide. Could a refill be sent to last until her appt.

## 2021-11-24 MED ORDER — LISINOPRIL 10 MG PO TABS: 10.0000 mg | ORAL_TABLET | Freq: Every day | ORAL | 1 refills | Status: DC

## 2021-11-24 MED ORDER — LISINOPRIL-HYDROCHLOROTHIAZIDE 20-12.5 MG PO TABS: 1.0000 | ORAL_TABLET | Freq: Every day | ORAL | 1 refills | Status: DC

## 2021-11-25 NOTE — Telephone Encounter (Signed)
Refills sent yesterday 

## 2022-01-04 NOTE — Progress Notes (Signed)
?  HPI with pertinent ROS:  ? ?CC: HTN, med refills ? ?HPI: ?Pleasant 51 year old female presenting today for the following: ? ?HTN follow up: checking pressures at home with readings of 120s/80s. Taking Lisinopril-HCTZ 20-12.'5mg'$  daily as prescribed. Has not been taking the extra '10mg'$  daily of Lisinopril because she didn't get it from the pharmacy. Notes that she has not had it since June or so of last year. Didn't call our office about it since her BP seemed ok at home. ? ?Over the last 2 months, has had about 3 episodes of palpitations that happened with position changes from sitting to standing. No other accompanying symptoms and unable to determine a cause. Each time her Fitbit said her pulse was about 100,  ? ?Also had some bowel changes over the last 2 months. History of constipation but over the past couple of month her stools have been loose. Started after eating a salad then experiencing a week of loose stool followed by a few days of improvement. No fever, chills, nausea, vomiting, hematochezia, or melena.  ? ?I reviewed the past medical history, family history, social history, surgical history, and allergies today and no changes were needed.  Please see the problem list section below in epic for further details. ? ? ?Physical exam:  ? ?General: Well Developed, well nourished, and in no acute distress.  ?Neuro: Alert and oriented x3.  ?HEENT: Normocephalic, atraumatic.  ?Skin: Warm and dry. ?Cardiac: Regular rate and rhythm, no murmurs rubs or gallops, no lower extremity edema.  ?Respiratory: Clear to auscultation bilaterally. Not using accessory muscles, speaking in full sentences. ? ?Impression and Recommendations:   ? ?1. Essential hypertension ?Checking labs. BP uncontrolled per office readings but there is a bit of white coat HTN. Continue Lisinopril-HCTZ 20-12.'5mg'$   ?- CBC with Differential/Platelet ?- COMPLETE METABOLIC PANEL WITH GFR ?- Lipid panel ?- lisinopril-hydrochlorothiazide (ZESTORETIC)  20-12.5 MG tablet; Take 1 tablet by mouth daily.  Dispense: 30 tablet; Refill: 1 ? ?2. Gastroesophageal reflux disease, unspecified whether esophagitis present ?Continue Nexium '40mg'$  daily.  ? ?3. Anxiety, generalized ?Refilling prn hydroxyzine.  ?- hydrOXYzine (ATARAX) 10 MG tablet; Take 1 tablet (10 mg total) by mouth 3 (three) times daily as needed for anxiety.  Dispense: 90 tablet; Refill: 1 ? ?4. History of vitamin D deficiency ?Checking Vitamin D.  ?- VITAMIN D 25 Hydroxy (Vit-D Deficiency, Fractures) ? ?5. Palpitations ?Checking labs including TSH.  ?- TSH ? ?6. Bowel habit changes ?Checking labs. Already has a relationship with a GI provider. Recommend reaching out to them for further evaluation.  ? ?Return in about 6 months (around 07/07/2022) for chronic disease follow up. ?___________________________________________ ?Clearnce Sorrel, DNP, APRN, FNP-BC ?Primary Care and Sports Medicine ?[place] ?

## 2022-01-05 ENCOUNTER — Encounter: Payer: Self-pay | Admitting: Medical-Surgical

## 2022-01-05 ENCOUNTER — Ambulatory Visit: Payer: Managed Care, Other (non HMO) | Admitting: Medical-Surgical

## 2022-01-05 VITALS — BP 164/99 | HR 69 | Resp 20 | Ht 63.0 in | Wt 129.7 lb

## 2022-01-05 DIAGNOSIS — R002 Palpitations: Secondary | ICD-10-CM

## 2022-01-05 DIAGNOSIS — F411 Generalized anxiety disorder: Secondary | ICD-10-CM

## 2022-01-05 DIAGNOSIS — R1313 Dysphagia, pharyngeal phase: Secondary | ICD-10-CM | POA: Insufficient documentation

## 2022-01-05 DIAGNOSIS — E063 Autoimmune thyroiditis: Secondary | ICD-10-CM | POA: Insufficient documentation

## 2022-01-05 DIAGNOSIS — K219 Gastro-esophageal reflux disease without esophagitis: Secondary | ICD-10-CM

## 2022-01-05 DIAGNOSIS — R300 Dysuria: Secondary | ICD-10-CM

## 2022-01-05 DIAGNOSIS — Z8639 Personal history of other endocrine, nutritional and metabolic disease: Secondary | ICD-10-CM | POA: Diagnosis not present

## 2022-01-05 DIAGNOSIS — R194 Change in bowel habit: Secondary | ICD-10-CM

## 2022-01-05 DIAGNOSIS — R1310 Dysphagia, unspecified: Secondary | ICD-10-CM | POA: Insufficient documentation

## 2022-01-05 DIAGNOSIS — I10 Essential (primary) hypertension: Secondary | ICD-10-CM

## 2022-01-05 DIAGNOSIS — R141 Gas pain: Secondary | ICD-10-CM | POA: Insufficient documentation

## 2022-01-05 DIAGNOSIS — B9689 Other specified bacterial agents as the cause of diseases classified elsewhere: Secondary | ICD-10-CM | POA: Insufficient documentation

## 2022-01-05 DIAGNOSIS — K5900 Constipation, unspecified: Secondary | ICD-10-CM | POA: Insufficient documentation

## 2022-01-05 MED ORDER — HYDROXYZINE HCL 10 MG PO TABS: 10.0000 mg | ORAL_TABLET | Freq: Three times a day (TID) | ORAL | 1 refills | Status: DC | PRN

## 2022-01-05 MED ORDER — LISINOPRIL-HYDROCHLOROTHIAZIDE 20-12.5 MG PO TABS: 1.0000 | ORAL_TABLET | Freq: Every day | ORAL | 1 refills | Status: DC

## 2022-01-06 LAB — COMPLETE METABOLIC PANEL WITH GFR
AG Ratio: 1.8 (calc) (ref 1.0–2.5)
ALT: 10 U/L (ref 6–29)
AST: 16 U/L (ref 10–35)
Albumin: 4.5 g/dL (ref 3.6–5.1)
Alkaline phosphatase (APISO): 63 U/L (ref 37–153)
BUN: 11 mg/dL (ref 7–25)
CO2: 29 mmol/L (ref 20–32)
Calcium: 9.5 mg/dL (ref 8.6–10.4)
Chloride: 97 mmol/L — ABNORMAL LOW (ref 98–110)
Creat: 0.67 mg/dL (ref 0.50–1.03)
Globulin: 2.5 g/dL (calc) (ref 1.9–3.7)
Glucose, Bld: 85 mg/dL (ref 65–99)
Potassium: 4.1 mmol/L (ref 3.5–5.3)
Sodium: 135 mmol/L (ref 135–146)
Total Bilirubin: 0.6 mg/dL (ref 0.2–1.2)
Total Protein: 7 g/dL (ref 6.1–8.1)
eGFR: 105 mL/min/{1.73_m2} (ref >=60)

## 2022-01-06 LAB — CBC WITH DIFFERENTIAL/PLATELET
Absolute Monocytes: 299 cells/uL (ref 200–950)
Basophils Absolute: 22 cells/uL (ref 0–200)
Basophils Relative: 0.5 %
Eosinophils Absolute: 22 cells/uL (ref 15–500)
Eosinophils Relative: 0.5 %
HCT: 40.9 % (ref 35.0–45.0)
Hemoglobin: 14 g/dL (ref 11.7–15.5)
Lymphs Abs: 1386 cells/uL (ref 850–3900)
MCH: 30 pg (ref 27.0–33.0)
MCHC: 34.2 g/dL (ref 32.0–36.0)
MCV: 87.8 fL (ref 80.0–100.0)
MPV: 11.4 fL (ref 7.5–12.5)
Monocytes Relative: 6.8 %
Neutro Abs: 2671 cells/uL (ref 1500–7800)
Neutrophils Relative %: 60.7 %
Platelets: 240 10*3/uL (ref 140–400)
RBC: 4.66 10*6/uL (ref 3.80–5.10)
RDW: 12.2 % (ref 11.0–15.0)
Total Lymphocyte: 31.5 %
WBC: 4.4 10*3/uL (ref 3.8–10.8)

## 2022-01-06 LAB — LIPID PANEL
Cholesterol: 219 mg/dL — ABNORMAL HIGH (ref <200)
HDL: 87 mg/dL (ref >=50)
LDL Cholesterol (Calc): 118 mg/dL (calc) — ABNORMAL HIGH
Non-HDL Cholesterol (Calc): 132 mg/dL (calc) — ABNORMAL HIGH (ref <130)
Total CHOL/HDL Ratio: 2.5 (calc) (ref <5.0)
Triglycerides: 52 mg/dL (ref <150)

## 2022-01-06 LAB — TSH: TSH: 3.77 mIU/L

## 2022-01-07 LAB — HM PAP SMEAR: HM Pap smear: NEGATIVE

## 2022-02-15 IMAGING — MG MM DIGITAL SCREENING BILAT W/ TOMO AND CAD
8 series · 9 of 24 positions shown · non-contrast
Comparison: Previous exam(s).

CLINICAL DATA: Screening.

EXAM:
DIGITAL SCREENING BILATERAL MAMMOGRAM WITH TOMOSYNTHESIS AND CAD
TECHNIQUE: Bilateral screening digital craniocaudal and mediolateral oblique
mammograms were obtained. Bilateral screening digital breast
tomosynthesis was performed. The images were evaluated with
computer-aided detection.

[L MLO synth-2D]
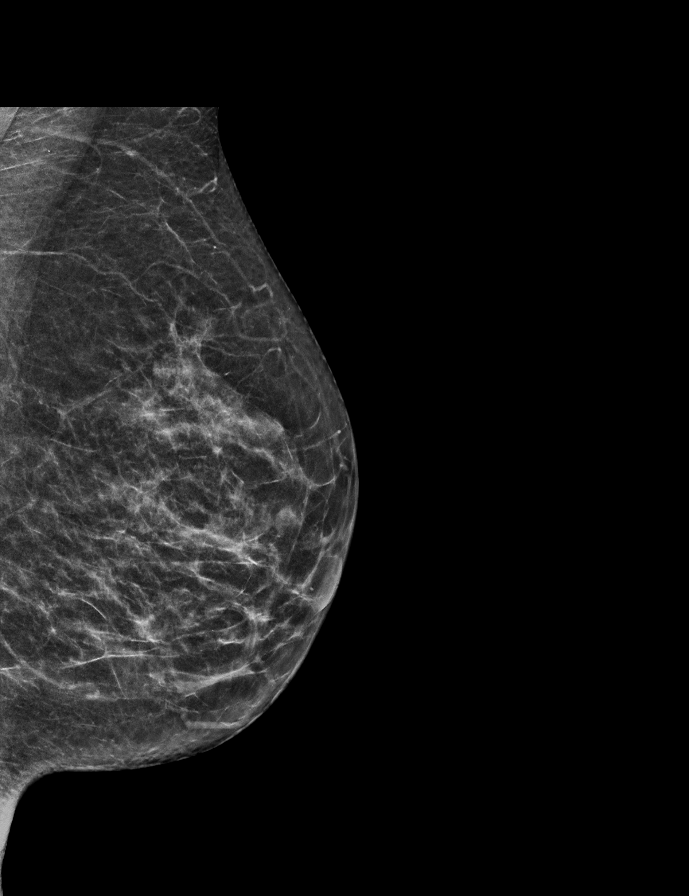

[R MLO synth-2D]
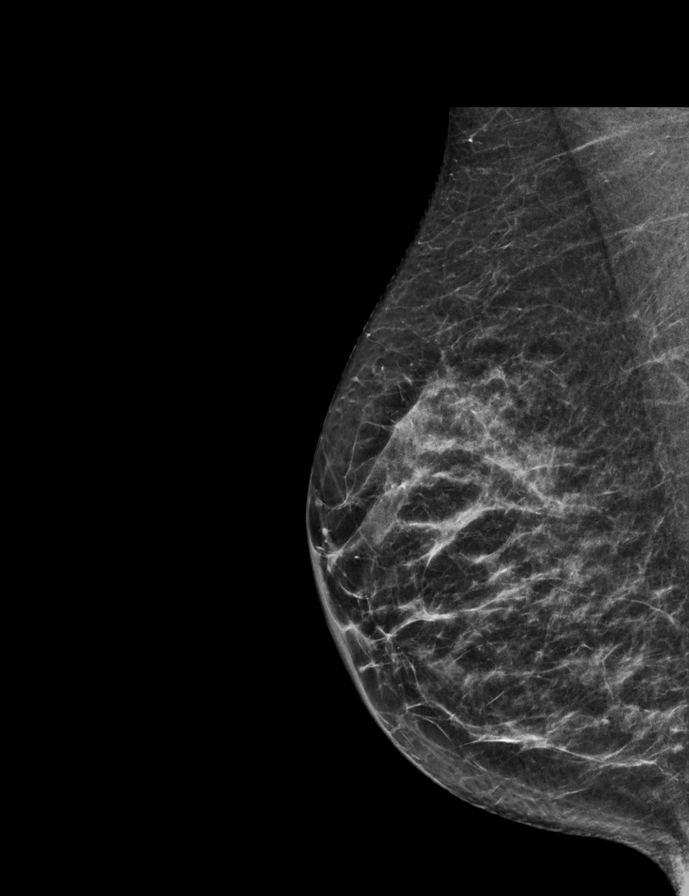

[R CC synth-2D]
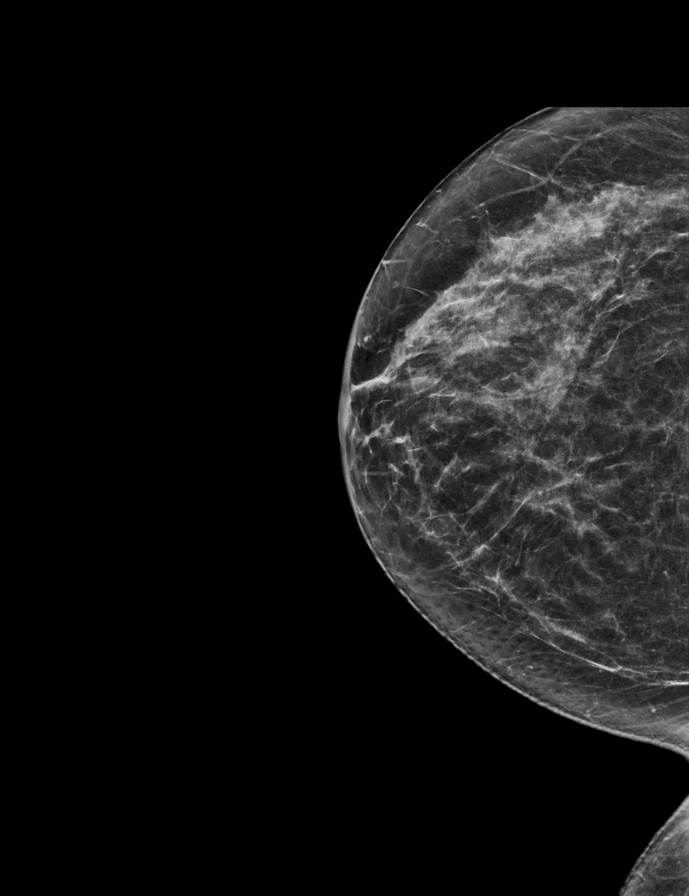

[L CC synth-2D]
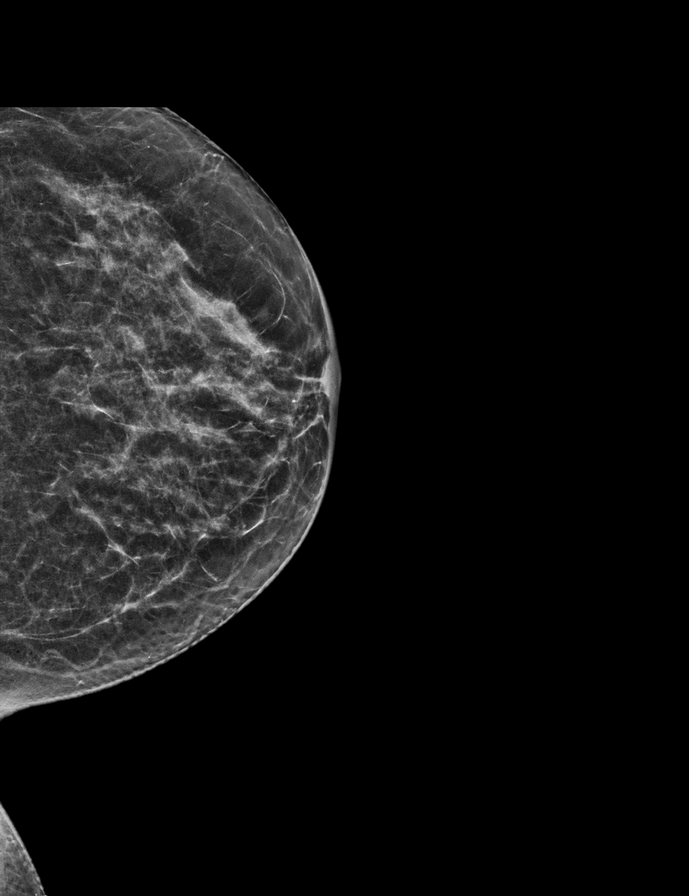

[R CC tomo · 2 of 60 frames shown]
[frame 20/60]
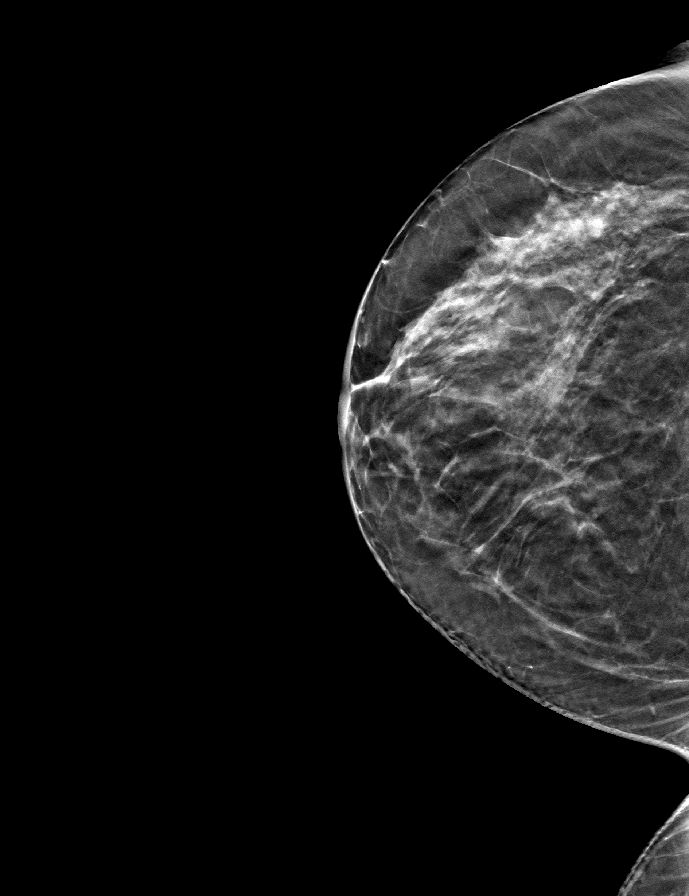
[frame 31/60]
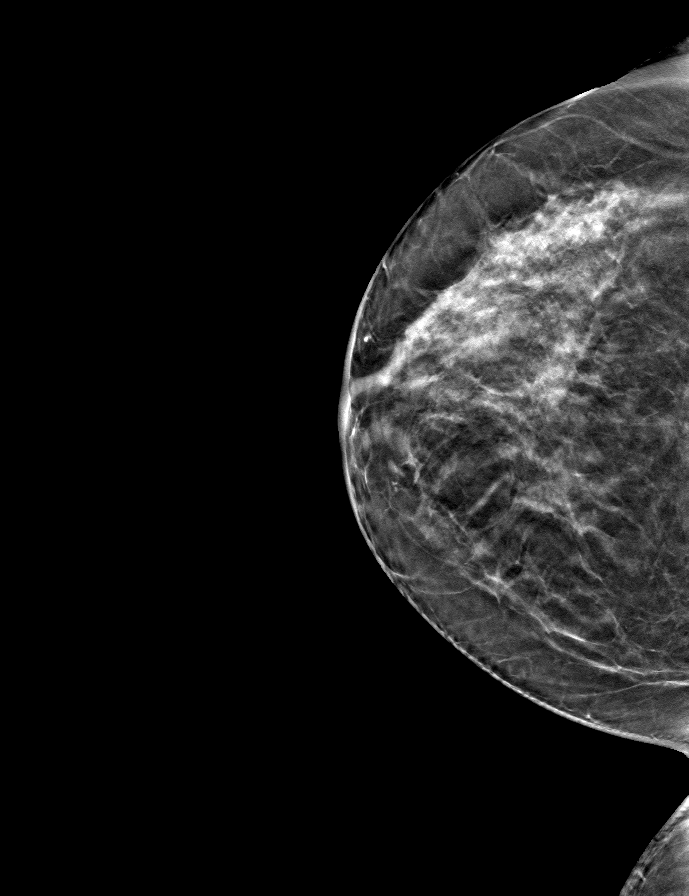

[L MLO tomo · tomo slice 27/54.0]
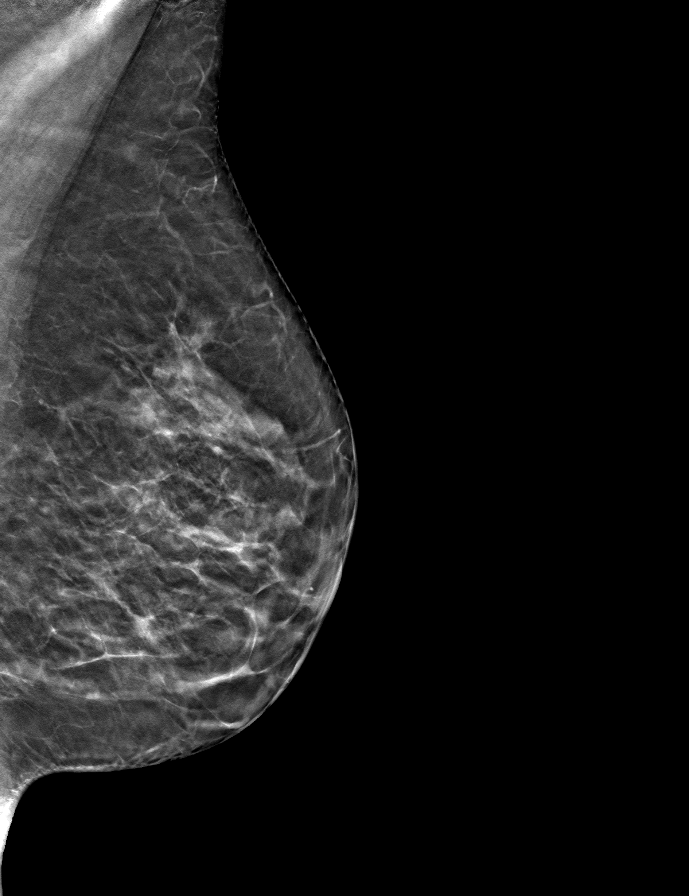

[R MLO tomo · tomo slice 29/57.0]
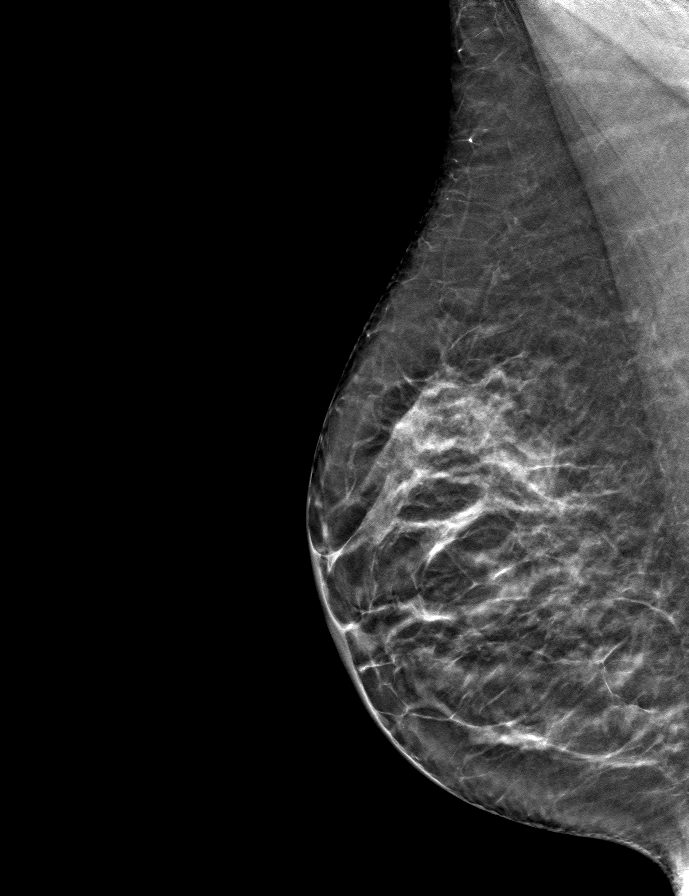

[L CC tomo · tomo slice 25/50.0]
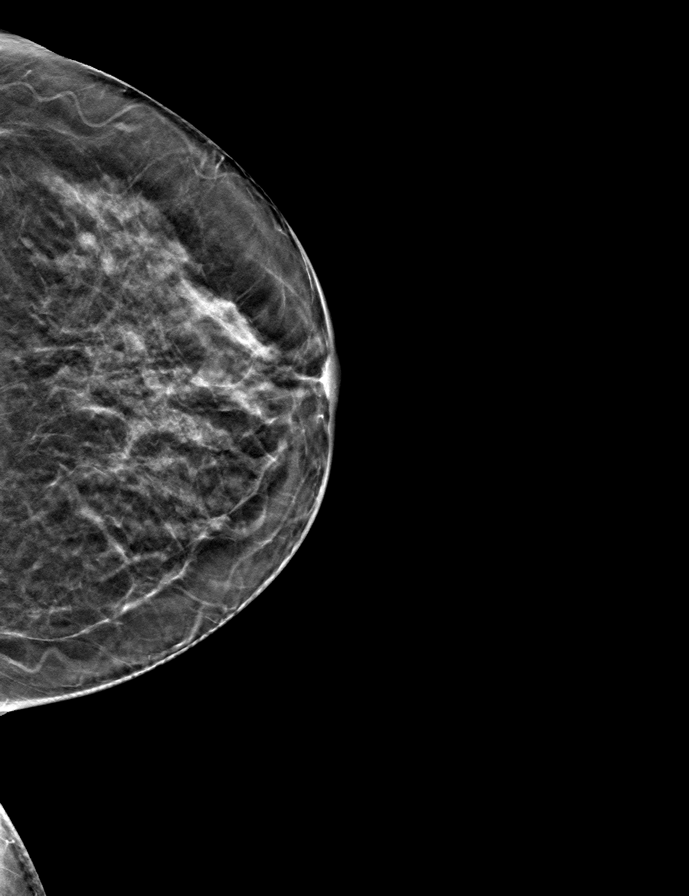

[9 of 24 positions shown; findings below may reference images not displayed]

ACR Breast Density Category c: The breast tissue is heterogeneously
dense, which may obscure small masses.
FINDINGS: There are no findings suspicious for malignancy.
IMPRESSION: No mammographic evidence of malignancy. A result letter of this
screening mammogram will be mailed directly to the patient.

RECOMMENDATION:
Screening mammogram in one year. (Code:Q3-W-BC3)

BI-RADS CATEGORY  1: Negative.

## 2022-02-16 ENCOUNTER — Encounter: Payer: Self-pay | Admitting: Sports Medicine

## 2022-02-16 ENCOUNTER — Ambulatory Visit: Payer: Commercial Managed Care - HMO | Admitting: Sports Medicine

## 2022-02-16 DIAGNOSIS — E871 Hypo-osmolality and hyponatremia: Secondary | ICD-10-CM

## 2022-02-16 DIAGNOSIS — R1012 Left upper quadrant pain: Secondary | ICD-10-CM

## 2022-02-16 DIAGNOSIS — G8929 Other chronic pain: Secondary | ICD-10-CM | POA: Insufficient documentation

## 2022-02-16 NOTE — Assessment & Plan Note (Addendum)
This is a very pleasant 51 year old female, she has a long history of left upper quadrant abdominal pain, worse with sitting, not associated with any activities, foods, no nausea, vomiting, diarrhea, constipation. On further questioning she has a fairly long and extensive history of abdominal complaints, she has had extensive work-up including upper and lower endoscopy, esophageal manometry, esophageal pH monitoring, gastric emptying study all of which were normal. She had a CT abdomen and pelvis as well as an abdominal ultrasound both of which were also normal with the exception of a small left adrenal mass. 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 Today she is complaining of the left upper quadrant pain, some early satiety. On exam she has no tenderness across the left costal margin, she does have some discomfort with palpation under the left costal margin and then the mid epigastrium. The rest of the abdominal exam is unrevealing. I do suspect this could be a functional abdominal process such as IBS, dyspepsia is also a possibility considering her midepigastric pain, and she has since come off of her PPI. Considering the chronicity of her discomfort we are going to proceed with fairly extensive work-up including CBC, CMP, amylase, lipase, urinalysis, I would like a CT abdomen pelvis with oral and IV contrast for further evaluation of her abdominal pain as well as the historically noted left adrenal incidentaloma. I would like to see her back when the work-up is done and if still having discomfort I would like her to restart her PPI.  Update: CT unremarkable with the exception of significant constipation, adding Linzess low-dose, will do this for 2 weeks before considering an increase in dose.

## 2022-02-16 NOTE — Progress Notes (Addendum)
    Procedures performed today:    None.  Independent interpretation of notes and tests performed by another provider:   None.  Brief History, Exam, Impression, and Recommendations:    Left upper quadrant pain This is a very pleasant 51 year old female, she has a long history of left upper quadrant abdominal pain, worse with sitting, not associated with any activities, foods, no nausea, vomiting, diarrhea, constipation. On further questioning she has a fairly long and extensive history of abdominal complaints, she has had extensive work-up including upper and lower endoscopy, esophageal manometry, esophageal pH monitoring, gastric emptying study all of which were normal. She had a CT abdomen and pelvis as well as an abdominal ultrasound both of which were also normal with the exception of a small left adrenal mass. 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 Today she is complaining of the left upper quadrant pain, some early satiety. On exam she has no tenderness across the left costal margin, she does have some discomfort with palpation under the left costal margin and then the mid epigastrium. The rest of the abdominal exam is unrevealing. I do suspect this could be a functional abdominal process such as IBS, dyspepsia is also a possibility considering her midepigastric pain, and she has since come off of her PPI. Considering the chronicity of her discomfort we are going to proceed with fairly extensive work-up including CBC, CMP, amylase, lipase, urinalysis, I would like a CT abdomen pelvis with oral and IV contrast for further evaluation of her abdominal pain as well as the historically noted left adrenal incidentaloma. I would like to see her back when the work-up is done and if still having discomfort I would like her to restart her PPI.  Update: CT unremarkable with the exception of significant constipation, adding Linzess low-dose, will do this for 2 weeks before considering an  increase in dose.  Hyponatremia Euvolemic, on lisinopril/HCTZ which is the likely cause. Serum sodium and chloride continue to be mildly low, this is likely related to her blood pressure medication.  I think we should consider switching from lisinopril/HCTZ to amlodipine initially, awaiting patient response.  I spent 40 minutes of total time managing this patient today, this includes chart review, face to face, and non-face to face time.  ___________________________________________ Gwen Her. Dianah Field, M.D., ABFM., CAQSM. Primary Care and [place] Instructor of [place] of [facility] of Medicine

## 2022-02-17 ENCOUNTER — Telehealth: Payer: Self-pay

## 2022-02-17 DIAGNOSIS — E871 Hypo-osmolality and hyponatremia: Secondary | ICD-10-CM | POA: Insufficient documentation

## 2022-02-17 LAB — COMPREHENSIVE METABOLIC PANEL
AG Ratio: 1.9 (calc) (ref 1.0–2.5)
ALT: 13 U/L (ref 6–29)
AST: 19 U/L (ref 10–35)
Albumin: 4.8 g/dL (ref 3.6–5.1)
Alkaline phosphatase (APISO): 70 U/L (ref 37–153)
BUN: 10 mg/dL (ref 7–25)
CO2: 32 mmol/L (ref 20–32)
Calcium: 9.5 mg/dL (ref 8.6–10.4)
Chloride: 91 mmol/L — ABNORMAL LOW (ref 98–110)
Creat: 0.69 mg/dL (ref 0.50–1.03)
Globulin: 2.5 g/dL (calc) (ref 1.9–3.7)
Glucose, Bld: 106 mg/dL — ABNORMAL HIGH (ref 65–99)
Potassium: 3.7 mmol/L (ref 3.5–5.3)
Sodium: 131 mmol/L — ABNORMAL LOW (ref 135–146)
Total Bilirubin: 0.6 mg/dL (ref 0.2–1.2)
Total Protein: 7.3 g/dL (ref 6.1–8.1)

## 2022-02-17 LAB — LIPASE: Lipase: 25 U/L (ref 7–60)

## 2022-02-17 LAB — CBC
HCT: 41.5 % (ref 35.0–45.0)
Hemoglobin: 14.3 g/dL (ref 11.7–15.5)
MCH: 30.5 pg (ref 27.0–33.0)
MCHC: 34.5 g/dL (ref 32.0–36.0)
MCV: 88.5 fL (ref 80.0–100.0)
MPV: 11.2 fL (ref 7.5–12.5)
Platelets: 302 10*3/uL (ref 140–400)
RBC: 4.69 10*6/uL (ref 3.80–5.10)
RDW: 12.2 % (ref 11.0–15.0)
WBC: 5.2 10*3/uL (ref 3.8–10.8)

## 2022-02-17 LAB — URINALYSIS W MICROSCOPIC + REFLEX CULTURE
Bacteria, UA: NONE SEEN /HPF
Bilirubin Urine: NEGATIVE
Glucose, UA: NEGATIVE
Hgb urine dipstick: NEGATIVE
Hyaline Cast: NONE SEEN /LPF
Ketones, ur: NEGATIVE
Leukocyte Esterase: NEGATIVE
Nitrites, Initial: NEGATIVE
Protein, ur: NEGATIVE
RBC / HPF: NONE SEEN /HPF (ref 0–2)
Specific Gravity, Urine: 1.012 (ref 1.001–1.035)
Squamous Epithelial / HPF: NONE SEEN /HPF (ref <=5)
WBC, UA: NONE SEEN /HPF (ref 0–5)
pH: 5.5 (ref 5.0–8.0)

## 2022-02-17 LAB — NO CULTURE INDICATED

## 2022-02-17 LAB — AMYLASE: Amylase: 35 U/L (ref 21–101)

## 2022-02-17 NOTE — Telephone Encounter (Signed)
Insurance denied auth for imaging. Per [place], provider can complete a peer to peer review at [phone]. Ref #[id number] ? ?Side note - clinical notes and previous imaging faxed to [facility] prior decision. ?

## 2022-02-17 NOTE — Assessment & Plan Note (Addendum)
Euvolemic, on lisinopril/HCTZ which is the likely cause. ?Serum sodium and chloride continue to be mildly low, this is likely related to her blood pressure medication.  I think we should consider switching from lisinopril/HCTZ to amlodipine initially, awaiting patient response. ?

## 2022-02-18 NOTE — Telephone Encounter (Signed)
Approved.  Auth: [id number] exp 02/16/22-08/15/22.

## 2022-02-18 NOTE — Telephone Encounter (Signed)
Noted. Imaging referral updated. Imaging dept notified to contact patient for scheduling.

## 2022-02-22 ENCOUNTER — Telehealth: Payer: Self-pay | Admitting: Medical-Surgical

## 2022-02-22 ENCOUNTER — Telehealth: Payer: Self-pay

## 2022-02-22 NOTE — Telephone Encounter (Signed)
Patient called asking why she isn't having an MRI instead of the CT.

## 2022-02-22 NOTE — Telephone Encounter (Signed)
MRI will not appropriately show changes in the bowel such as diverticulosis, diverticulitis, colitis, gastric wall thickening, and thus we would not get the answer we need.  She is worried well, just reassure her that a CT is safe and is the best test for this.  She does not need to be worried about the radiation exposure.

## 2022-02-22 NOTE — Telephone Encounter (Signed)
Patient came in to office and asked if she could have an MRI instead of the CT scan, since she has had multiple CT's scans in the past. Patient stated she would prefer an MRI if this will generate the same results. - lmr

## 2022-02-23 ENCOUNTER — Ambulatory Visit (INDEPENDENT_AMBULATORY_CARE_PROVIDER_SITE_OTHER): Payer: Commercial Managed Care - HMO

## 2022-02-23 DIAGNOSIS — R1012 Left upper quadrant pain: Secondary | ICD-10-CM | POA: Diagnosis not present

## 2022-02-23 MED ORDER — IOHEXOL 300 MG/ML  SOLN
100.0000 mL | Freq: Once | INTRAMUSCULAR | Status: AC | PRN
  Administered 2022-02-23: 100 mL via INTRAVENOUS

## 2022-02-24 MED ORDER — LINACLOTIDE 72 MCG PO CAPS: 72.0000 ug | ORAL_CAPSULE | Freq: Every day | ORAL | 0 refills | Status: DC

## 2022-02-24 NOTE — Telephone Encounter (Signed)
Patient aware.

## 2022-02-24 NOTE — Addendum Note (Signed)
Addended by: Silverio Decamp on: 02/24/2022 09:40 AM   Modules accepted: Orders

## 2022-03-02 ENCOUNTER — Ambulatory Visit (INDEPENDENT_AMBULATORY_CARE_PROVIDER_SITE_OTHER): Payer: Commercial Managed Care - HMO | Admitting: Sports Medicine

## 2022-03-02 DIAGNOSIS — G8929 Other chronic pain: Secondary | ICD-10-CM

## 2022-03-02 DIAGNOSIS — R109 Unspecified abdominal pain: Secondary | ICD-10-CM

## 2022-03-02 NOTE — Progress Notes (Signed)
    Procedures performed today:    None.  Independent interpretation of notes and tests performed by another provider:   None.  Brief History, Exam, Impression, and Recommendations:    GERD and IBS-D Maria Mccarty returns, she is a pleasant 51 year old female with a long history of upper abdominal pain, typically worse with sitting and not associated with foods, activities, no nausea, vomiting, diarrhea, no subjective constipation. She has had an extensive work-up including multiple upper endoscopies, 1 of which showed GERD, colonoscopy normal except for a couple of polyps, normal esophageal manometry, esophageal pH monitoring, gastric emptying study. Ultimately we got a CT that showed a significant stool burden. She just now got Linzess and I would like her to do it for a solid month before considering increasing the dose. 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 I did explain to her that IBS was a chronic process, and that she had had a very extensive work-up thus far I did not think any additional work-up was needed. I explained that sometimes we were unable to get full control of IBS symptoms. She will follow-up with her PCP in a month. It was expensive and I did ask her to try to get a discount coupon from online.  I spent 30 minutes of total time managing this patient today, this includes chart review, face to face, and non-face to face time.  ___________________________________________ Gwen Her. Dianah Field, M.D., ABFM., CAQSM. Primary Care and [place] Instructor of [place] of [facility] of Medicine

## 2022-03-02 NOTE — Assessment & Plan Note (Signed)
Maria Mccarty returns, she is a pleasant 51 year old female with a long history of upper abdominal pain, typically worse with sitting and not associated with foods, activities, no nausea, vomiting, diarrhea, no subjective constipation. She has had an extensive work-up including multiple upper endoscopies, 1 of which showed GERD, colonoscopy normal except for a couple of polyps, normal esophageal manometry, esophageal pH monitoring, gastric emptying study. Ultimately we got a CT that showed a significant stool burden. She just now got Linzess and I would like her to do it for a solid month before considering increasing the dose. 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 I did explain to her that IBS was a chronic process, and that she had had a very extensive work-up thus far I did not think any additional work-up was needed. I explained that sometimes we were unable to get full control of IBS symptoms. She will follow-up with her PCP in a month. It was expensive and I did ask her to try to get a discount coupon from online.

## 2022-03-18 ENCOUNTER — Encounter: Payer: Self-pay | Admitting: Medical-Surgical

## 2022-05-07 ENCOUNTER — Other Ambulatory Visit: Payer: Self-pay | Admitting: Medical-Surgical

## 2022-05-07 DIAGNOSIS — I10 Essential (primary) hypertension: Secondary | ICD-10-CM

## 2022-05-10 ENCOUNTER — Other Ambulatory Visit: Payer: Self-pay

## 2022-05-10 DIAGNOSIS — I10 Essential (primary) hypertension: Secondary | ICD-10-CM

## 2022-05-10 MED ORDER — LISINOPRIL 10 MG PO TABS: 10.0000 mg | ORAL_TABLET | Freq: Every day | ORAL | 0 refills | Status: DC

## 2022-05-12 NOTE — Telephone Encounter (Signed)
Patient needs appointment for further refills.  Last office visit 01/05/2022  6 month follow up around 07/07/2022 not scheduled  Sent 30 day supply with 1 refill to the pharmacy

## 2022-05-12 NOTE — Telephone Encounter (Signed)
Patient has been scheduled for 07/07/22. AMUCK

## 2022-05-12 NOTE — Telephone Encounter (Signed)
Lisinopril was sent to the pharmacy on 05/10/22  This Rx was last filled 01/05/2022  Last office visit 01/05/2022  No future appointment scheduled.  Is the patient taking this medication also?

## 2022-07-07 ENCOUNTER — Encounter: Payer: Self-pay | Admitting: Medical-Surgical

## 2022-07-07 ENCOUNTER — Ambulatory Visit: Payer: Commercial Managed Care - HMO | Admitting: Medical-Surgical

## 2022-07-07 VITALS — BP 133/77 | HR 72 | Resp 20 | Ht 63.0 in | Wt 132.9 lb

## 2022-07-07 DIAGNOSIS — E039 Hypothyroidism, unspecified: Secondary | ICD-10-CM | POA: Diagnosis not present

## 2022-07-07 DIAGNOSIS — F411 Generalized anxiety disorder: Secondary | ICD-10-CM

## 2022-07-07 DIAGNOSIS — K5904 Chronic idiopathic constipation: Secondary | ICD-10-CM

## 2022-07-07 DIAGNOSIS — K219 Gastro-esophageal reflux disease without esophagitis: Secondary | ICD-10-CM | POA: Diagnosis not present

## 2022-07-07 DIAGNOSIS — I10 Essential (primary) hypertension: Secondary | ICD-10-CM

## 2022-07-07 MED ORDER — LISINOPRIL-HYDROCHLOROTHIAZIDE 20-12.5 MG PO TABS: 1.0000 | ORAL_TABLET | Freq: Every day | ORAL | 1 refills | Status: DC

## 2022-07-07 MED ORDER — LISINOPRIL 10 MG PO TABS: 10.0000 mg | ORAL_TABLET | Freq: Every day | ORAL | 1 refills | Status: DC

## 2022-07-07 MED ORDER — NEXIUM 40 MG PO CPDR: 40.0000 mg | DELAYED_RELEASE_CAPSULE | Freq: Every day | ORAL | 1 refills | Status: DC | PRN

## 2022-07-07 NOTE — Progress Notes (Signed)
Established Patient Office Visit  Subjective   Patient ID: Maria Mccarty, female   DOB: [birth year] Age: 51 y.o. MRN: [id number]   Chief Complaint  Patient presents with   Medication Refill   HPI Pleasant 51 year old female presenting today for follow-up on:  Hypertension: Checking blood pressures at home with average readings in the 130/80.  She recently increased her sodium intake because her sodium on the last check in May was low at 129.  Reports that she is hit or miss on exercise and has not committed to doing this regularly.  Taking lisinopril-HCTZ 20-12.5 mg along with lisinopril 10 mg daily, tolerating well without side effects. Denies CP, SOB, palpitations, lower extremity edema, dizziness, headaches, or vision changes.  Idiopathic constipation: Tried Linzess but notes that this was not tolerable and not affordable.  Since then, she has started a daily stool softener as well as a fiber supplement which seems to be helping regulate things.  GERD/LPR: Taking Nexium 40 mg daily as needed.  Has not taken this in a couple of months and is having no current concerns.  Notes that she is only willing to take the brand name because she knows that this works and she does not trust generic medications.  Feels that her symptoms are well controlled with diet modifications at the moment.  Would like to keep this on her file as she may use it  Hypothyroidism: not currently taking thyroid replacement. Last TSH was normal but it had jumped a bit from previous levels. No current concerning symptoms.   Anxiety: does still have quite a bit of anxiety, especially surrounding her health. Is very nervous regarding medications and prefers not to take anything if not mandatory. Not currently taking anything for mood management although she does have hydroxyzine '10mg'$  TID as needed.    Objective:    Vitals:   07/07/22 0925  BP: 133/77  Pulse: 72  Resp: 20  Height: '5\' 3"'$  (1.6 m)  Weight: 132 lb  14.4 oz (60.3 kg)  SpO2: 100%  BMI (Calculated): 23.55    Physical Exam Vitals reviewed.  Constitutional:      General: She is not in acute distress.    Appearance: Normal appearance. She is not ill-appearing.  HENT:     Head: Normocephalic and atraumatic.  Cardiovascular:     Rate and Rhythm: Normal rate and regular rhythm.     Pulses: Normal pulses.     Heart sounds: Normal heart sounds.  Pulmonary:     Effort: Pulmonary effort is normal. No respiratory distress.     Breath sounds: Normal breath sounds. No wheezing, rhonchi or rales.  Skin:    General: Skin is warm and dry.  Neurological:     Mental Status: She is alert and oriented to person, place, and time.  Psychiatric:        Mood and Affect: Mood normal.        Behavior: Behavior normal.        Thought Content: Thought content normal.        Judgment: Judgment normal.   No results found for this or any previous visit (from the past 24 hour(s)).     The 10-year ASCVD risk score (Arnett DK, et al., 2019) is: 1.4%   Values used to calculate the score:     Age: 35 years     Sex: Female     Is Non-Hispanic African American: No     Diabetic: No  Tobacco smoker: No     Systolic Blood Pressure: 937 mmHg     Is BP treated: Yes     HDL Cholesterol: 87 mg/dL     Total Cholesterol: 219 mg/dL   Assessment & Plan:   1. Essential hypertension BP at goal. Continue Lisinopril '10mg'$  and Lisinopril-HCTZ 20-12.'5mg'$  daily as prescribed. Checking CMP.  - lisinopril-hydrochlorothiazide (ZESTORETIC) 20-12.5 MG tablet; Take 1 tablet by mouth daily.  Dispense: 90 tablet; Refill: 1 - lisinopril (ZESTRIL) 10 MG tablet; Take 1 tablet (10 mg total) by mouth daily.  Dispense: 90 tablet; Refill: 1 - COMPLETE METABOLIC PANEL WITH GFR  2. Chronic idiopathic constipation Continue stool softener and fiber gummies as needed.   3. Gastroesophageal reflux disease, unspecified whether esophagitis present 4. Laryngopharyngeal reflux  (LPR) Continue Nexium '40mg'$  daily as needed.   5. Hypothyroidism (acquired) Checking TSH.  - TSH  6. Anxiety, generalized Managed with lifestyle modifications. No medications at this time and no interest in starting any. Ok to use hydroxyzine as needed if desired.    Return in about 6 months (around 01/06/2023) for HTN follow up.  ___________________________________________ Clearnce Sorrel, DNP, APRN, FNP-BC Primary Care and [place]

## 2022-07-08 LAB — COMPLETE METABOLIC PANEL WITH GFR
AG Ratio: 2.1 (calc) (ref 1.0–2.5)
ALT: 14 U/L (ref 6–29)
AST: 18 U/L (ref 10–35)
Albumin: 4.8 g/dL (ref 3.6–5.1)
Alkaline phosphatase (APISO): 73 U/L (ref 37–153)
BUN: 8 mg/dL (ref 7–25)
CO2: 31 mmol/L (ref 20–32)
Calcium: 9.3 mg/dL (ref 8.6–10.4)
Chloride: 93 mmol/L — ABNORMAL LOW (ref 98–110)
Creat: 0.55 mg/dL (ref 0.50–1.03)
Globulin: 2.3 g/dL (calc) (ref 1.9–3.7)
Glucose, Bld: 87 mg/dL (ref 65–99)
Potassium: 4.3 mmol/L (ref 3.5–5.3)
Sodium: 132 mmol/L — ABNORMAL LOW (ref 135–146)
Total Bilirubin: 0.5 mg/dL (ref 0.2–1.2)
Total Protein: 7.1 g/dL (ref 6.1–8.1)
eGFR: 110 mL/min/{1.73_m2} (ref >=60)

## 2022-07-08 LAB — TSH: TSH: 3.47 mIU/L

## 2022-07-19 ENCOUNTER — Other Ambulatory Visit: Payer: Self-pay | Admitting: Medical-Surgical

## 2022-07-19 DIAGNOSIS — I10 Essential (primary) hypertension: Secondary | ICD-10-CM

## 2022-07-19 MED ORDER — LISINOPRIL-HYDROCHLOROTHIAZIDE 20-12.5 MG PO TABS: 1.0000 | ORAL_TABLET | Freq: Every day | ORAL | 1 refills | Status: DC

## 2022-08-24 ENCOUNTER — Ambulatory Visit: Payer: Commercial Managed Care - HMO | Admitting: Sports Medicine

## 2022-08-24 ENCOUNTER — Other Ambulatory Visit: Payer: Self-pay

## 2022-08-24 ENCOUNTER — Ambulatory Visit (INDEPENDENT_AMBULATORY_CARE_PROVIDER_SITE_OTHER): Payer: Commercial Managed Care - HMO

## 2022-08-24 DIAGNOSIS — M5416 Radiculopathy, lumbar region: Secondary | ICD-10-CM

## 2022-08-24 DIAGNOSIS — I10 Essential (primary) hypertension: Secondary | ICD-10-CM

## 2022-08-24 MED ORDER — LISINOPRIL 10 MG PO TABS: 10.0000 mg | ORAL_TABLET | Freq: Every day | ORAL | 1 refills | Status: DC

## 2022-08-24 MED ORDER — PREDNISONE 50 MG PO TABS: ORAL_TABLET | ORAL | 0 refills | Status: DC

## 2022-08-24 NOTE — Progress Notes (Signed)
    Procedures performed today:    None.  Independent interpretation of notes and tests performed by another provider:   None.  Brief History, Exam, Impression, and Recommendations:    Left lumbar radiculitis 1.5 months axial back pain rating down the leg to the foot with numbness and tingling, worse with sitting, flexion, Valsalva, prednisone, x-rays, home physical therapy, return to see me in 6 weeks, MRI for interventional planning if not better.    ____________________________________________ Gwen Her. Dianah Field, M.D., ABFM., CAQSM., AME. Primary Care and Sports Medicine [facility]  Adjunct Professor of [place] of [facility] of Medicine  Risk manager

## 2022-08-24 NOTE — Assessment & Plan Note (Signed)
1.5 months axial back pain rating down the leg to the foot with numbness and tingling, worse with sitting, flexion, Valsalva, prednisone, x-rays, home physical therapy, return to see me in 6 weeks, MRI for interventional planning if not better.

## 2022-08-24 NOTE — Addendum Note (Signed)
Addended by: Tarri Glenn A on: 08/24/2022 04:39 PM   Modules accepted: Orders

## 2022-10-05 ENCOUNTER — Ambulatory Visit: Payer: Commercial Managed Care - HMO | Admitting: Sports Medicine

## 2022-10-05 DIAGNOSIS — M5416 Radiculopathy, lumbar region: Secondary | ICD-10-CM

## 2022-10-05 DIAGNOSIS — M47812 Spondylosis without myelopathy or radiculopathy, cervical region: Secondary | ICD-10-CM

## 2022-10-05 NOTE — Assessment & Plan Note (Signed)
Improvement now having recurrence of discomfort, x-rays unrevealing but a CT from sometime ago for abdominal pain did show L4-L5 spinal stenosis as well as multilevel facet arthropathy. At this point she has failed greater than 6-week conservative treatment, we will proceed with MRI. She understands that the next step is Neurontin versus an epidural.

## 2022-10-05 NOTE — Assessment & Plan Note (Signed)
Increasing axial neck pain, declines imaging, adding home conditioning for now, return in 6 weeks for this.

## 2022-10-05 NOTE — Progress Notes (Signed)
    Procedures performed today:    None.  Independent interpretation of notes and tests performed by another provider:   None.  Brief History, Exam, Impression, and Recommendations:    Cervical spondylosis Increasing axial neck pain, declines imaging, adding home conditioning for now, return in 6 weeks for this.  Left lumbar radiculitis Improvement now having recurrence of discomfort, x-rays unrevealing but a CT from sometime ago for abdominal pain did show L4-L5 spinal stenosis as well as multilevel facet arthropathy. At this point she has failed greater than 6-week conservative treatment, we will proceed with MRI. She understands that the next step is Neurontin versus an epidural.  I spent 30 minutes of total time managing this patient today, this includes chart review, face to face, and non-face to face time.  ____________________________________________ Gwen Her. Dianah Field, M.D., ABFM., CAQSM., AME. Primary Care and Sports Medicine [place] [facility]  Adjunct Professor of [place] of [facility] of Medicine  Risk manager

## 2022-10-10 IMAGING — CT CT ABD-PELV W/ CM
2 of 5 series · 14 of 46 positions shown, 16 images · IV contrast (APPLIED)
Comparison: Remote abdominopelvic CT 01/30/2010

CLINICAL DATA: Acute nonlocalized abdominal pain. Left upper
quadrant pain. History of left adrenal nodule.

EXAM:
CT ABDOMEN AND PELVIS WITH CONTRAST
TECHNIQUE: Multidetector CT imaging of the abdomen and pelvis was performed
using the standard protocol following bolus administration of
intravenous contrast.

[Series 2: axial st · axial · 0.61mm/px · z∈[-464,-64]mm · 11 of 90 slices shown, 13 images]
[im 5/90  soft-tissue]
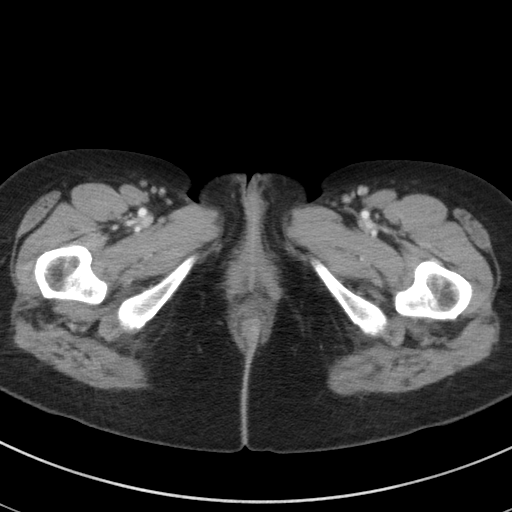
[im 5/90  bone]
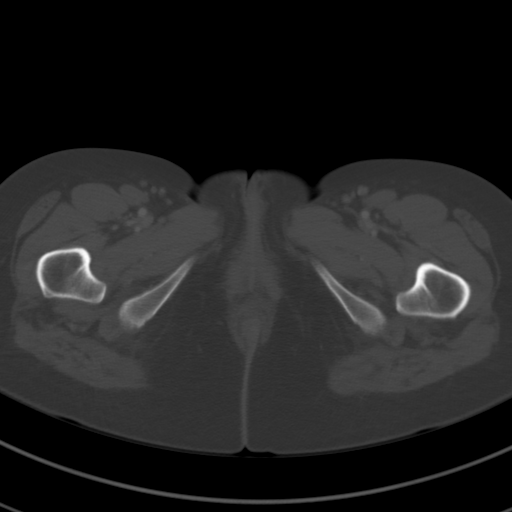
[im 15/90  soft-tissue]
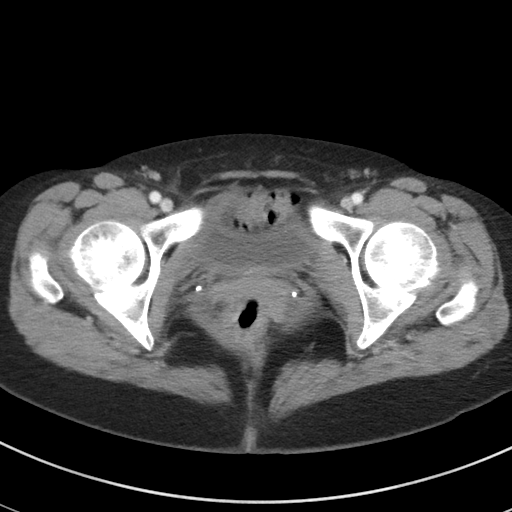
[im 24/90  soft-tissue]
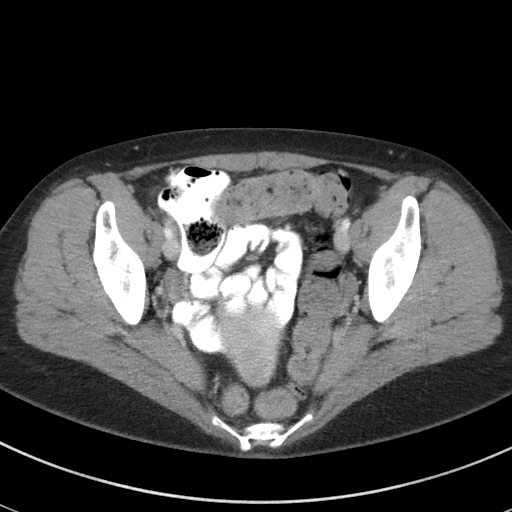
[im 29/90  soft-tissue]
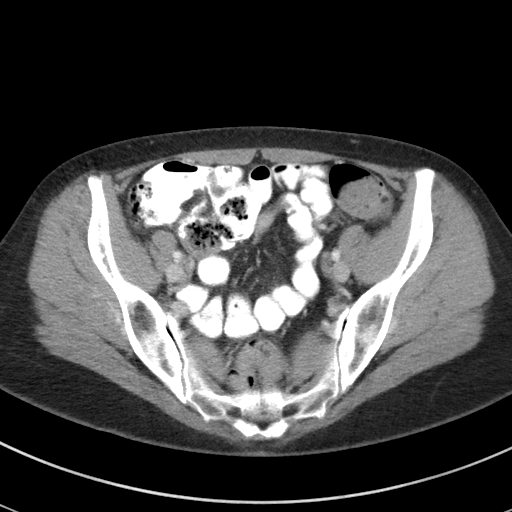
[im 38/90  soft-tissue]
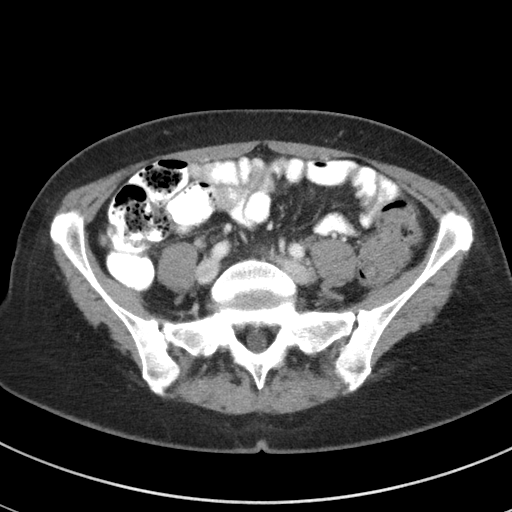
[im 47/90  soft-tissue]
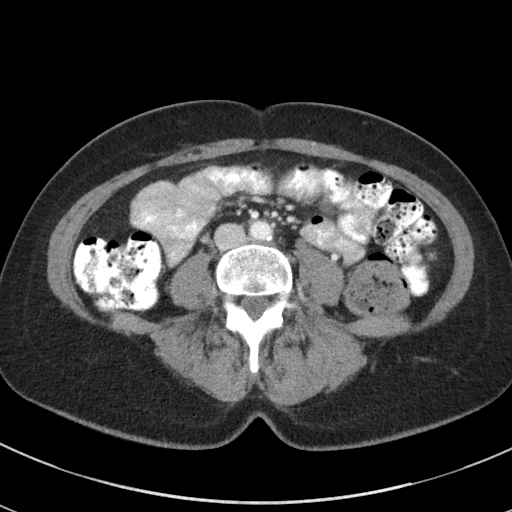
[im 52/90  soft-tissue]
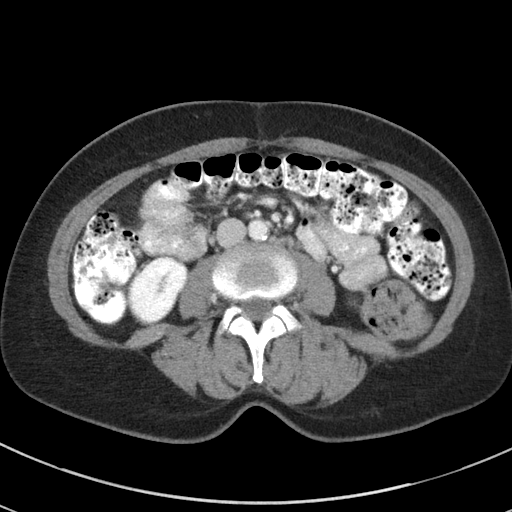
[im 61/90  soft-tissue]
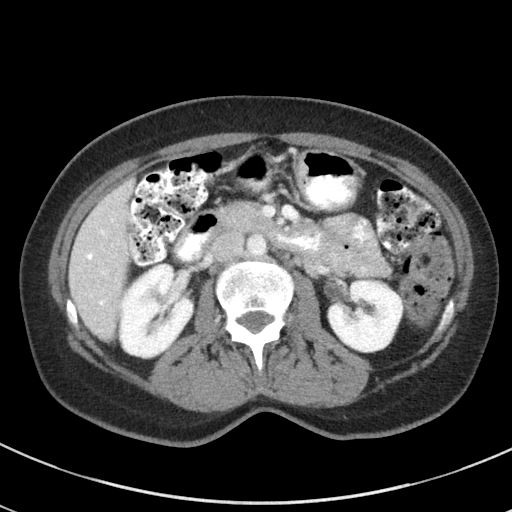
[im 66/90  soft-tissue]
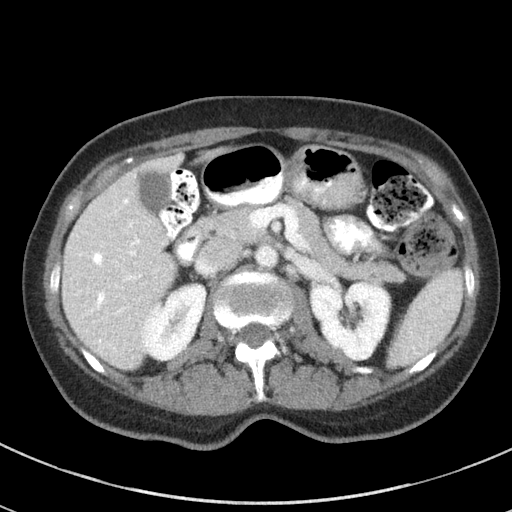
[im 66/90  bone]
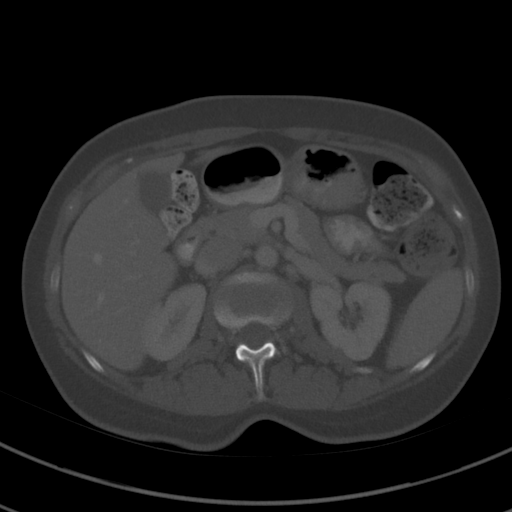
[im 75/90  soft-tissue]
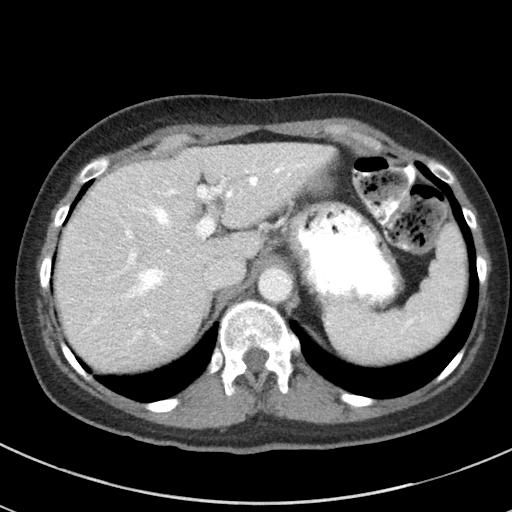
[im 85/90  soft-tissue]
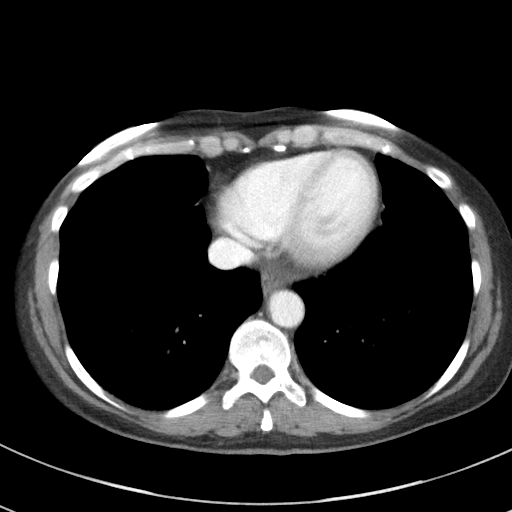

[Series 5: coronal st · coronal · 0.61mm/px · 3 of 69 slices shown]
[im 23/69  soft-tissue]
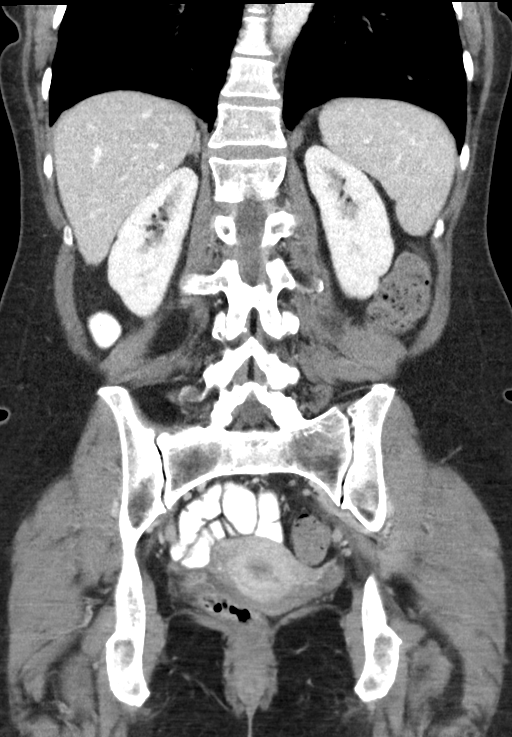
[im 31/69  soft-tissue]
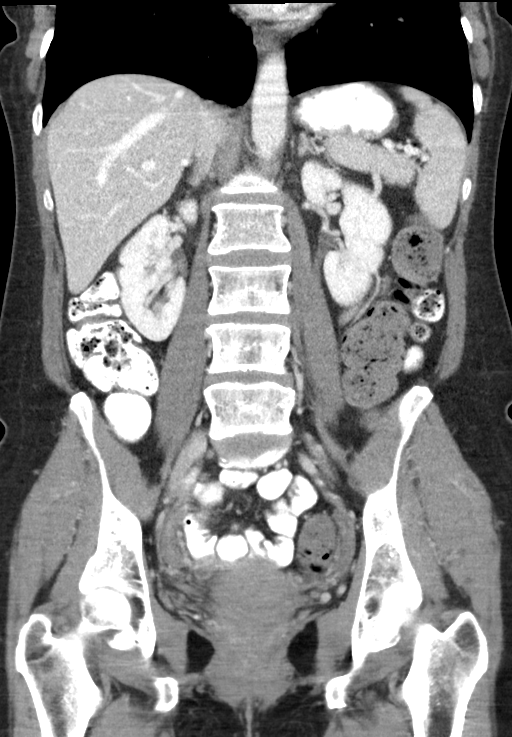
[im 38/69  soft-tissue]
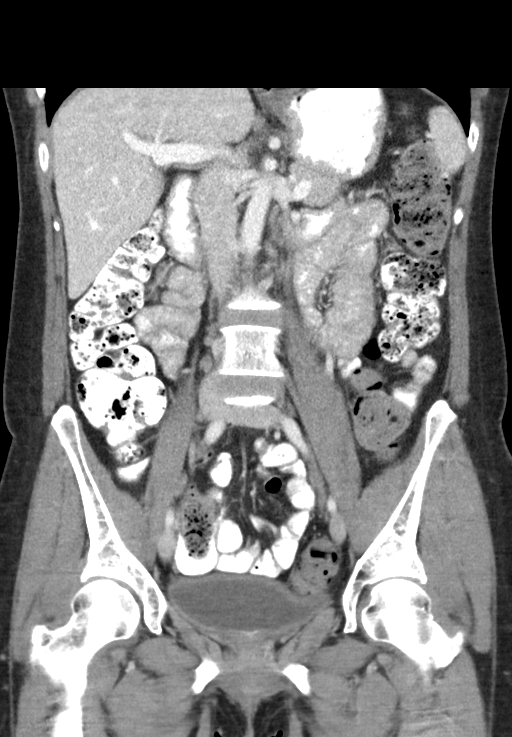

[14 of 46 positions shown; findings below may reference images not displayed]

RADIATION DOSE REDUCTION: This exam was performed according to the
departmental dose-optimization program which includes automated
exposure control, adjustment of the mA and/or kV according to
patient size and/or use of iterative reconstruction technique.

CONTRAST:  100mL OMNIPAQUE IOHEXOL 300 MG/ML  SOLN
FINDINGS: Lower chest: No focal airspace disease or pleural effusion. The
heart is normal in size.

Hepatobiliary: No focal liver abnormality is seen. Homogeneous
attenuation. No capsular nodularity. Gallbladder physiologically
distended, no calcified stone. No biliary dilatation.

Pancreas: Unremarkable. No pancreatic ductal dilatation or
surrounding inflammatory changes. No pancreatic mass.

Spleen: Normal in size without focal abnormality.

Adrenals/Urinary Tract: Subcentimeter thickening of the left adrenal
gland without dominant nodule, similar in appearance to 6755 exam.
Normal right adrenal gland. No hydronephrosis or perinephric edema.
Homogeneous renal enhancement with symmetric excretion on delayed
phase imaging. No renal calculi. No cystic or solid renal lesion.
Urinary bladder is partially distended without wall thickening.

Stomach/Bowel: Unremarkable appearance of the stomach. Normal
positioning of the duodenum and ligament of Treitz. Normal small
bowel without obstruction, inflammation, or wall thickening. Normal
terminal ileum. Normal air-filled appendix. There is a large volume
of stool throughout the colon. Distal transverse colonic redundancy.
No colonic wall thickening or pericolonic edema. No evidence of
colonic mass. No significant diverticular disease.

Vascular/Lymphatic: Normal caliber abdominal aorta. Mild
atherosclerosis. Circumaortic left renal vein. Patent portal vein.
Patent splenic and mesenteric veins. No acute vascular findings.
There is no abdominopelvic adenopathy.

Reproductive: The uterus is retroverted. There is a 2.8 cm anterior
fundal fibroid. The ovaries are not well-defined on the current
exam, there is no adnexal mass.

Other: No ascites. No abdominopelvic collection. No omental
thickening. Tiny fat containing umbilical hernia.

Musculoskeletal: There are no acute or suspicious osseous
abnormalities. No muscular findings to explain left-sided pain.
IMPRESSION: 1. No acute abnormality in the abdomen/pelvis.
2. Large volume of stool throughout the colon, can be seen with
constipation. No other explanation for pain.
3. There is no adrenal nodule or evidence of adenoma. Subcentimeter
thickening of the left adrenal gland appears similar to remote prior
exam.
4. Uterine fibroid.

Aortic Atherosclerosis ([id number].L).

## 2022-10-14 ENCOUNTER — Telehealth: Payer: Self-pay

## 2022-10-14 NOTE — Telephone Encounter (Signed)
Thank you for letting me know, I will forward this to [place] and Bonnita Nasuti to see if they have any ideas

## 2022-10-14 NOTE — Telephone Encounter (Signed)
Patient called office concerning her MRI, patient hasn't heard from imaging, please advise, thanks.

## 2022-10-15 NOTE — Telephone Encounter (Signed)
An Josem Kaufmann was obtained for the MRI study. Ref #[id number]. Valid from 10/14/22 through 04/12/23. Imaging dept was notified.

## 2022-10-19 ENCOUNTER — Ambulatory Visit (INDEPENDENT_AMBULATORY_CARE_PROVIDER_SITE_OTHER): Payer: Commercial Managed Care - HMO

## 2022-10-19 DIAGNOSIS — M5416 Radiculopathy, lumbar region: Secondary | ICD-10-CM | POA: Diagnosis not present

## 2022-10-22 ENCOUNTER — Ambulatory Visit
Admission: RE | Admit: 2022-10-22 | Discharge: 2022-10-22 | Disposition: A | Discharge status: Home / Self Care | Payer: Commercial Managed Care - HMO | Source: Ambulatory Visit | Attending: Medical-Surgical | Admitting: Medical-Surgical

## 2022-10-22 ENCOUNTER — Other Ambulatory Visit: Payer: Self-pay | Admitting: Medical-Surgical

## 2022-10-22 DIAGNOSIS — Z1231 Encounter for screening mammogram for malignant neoplasm of breast: Secondary | ICD-10-CM

## 2022-10-26 ENCOUNTER — Telehealth: Payer: Commercial Managed Care - HMO | Admitting: Physician Assistant

## 2022-10-26 DIAGNOSIS — N760 Acute vaginitis: Secondary | ICD-10-CM | POA: Diagnosis not present

## 2022-10-26 DIAGNOSIS — U071 COVID-19: Secondary | ICD-10-CM | POA: Diagnosis not present

## 2022-10-26 DIAGNOSIS — B9689 Other specified bacterial agents as the cause of diseases classified elsewhere: Secondary | ICD-10-CM

## 2022-10-26 MED ORDER — MOLNUPIRAVIR EUA 200MG CAPSULE: 4.0000 | ORAL_CAPSULE | Freq: Two times a day (BID) | ORAL | 0 refills | Status: DC

## 2022-10-26 MED ORDER — ALBUTEROL SULFATE HFA 108 (90 BASE) MCG/ACT IN AERS: 2.0000 | INHALATION_SPRAY | Freq: Four times a day (QID) | RESPIRATORY_TRACT | 0 refills | Status: DC | PRN

## 2022-10-26 MED ORDER — CLINDAMYCIN HCL 300 MG PO CAPS: 300.0000 mg | ORAL_CAPSULE | Freq: Two times a day (BID) | ORAL | 0 refills | Status: DC

## 2022-10-26 NOTE — Progress Notes (Signed)
Virtual Visit Consent   Misa Fedorko Boss, you are scheduled for a virtual visit with a [facility] provider today. Just as with appointments in the office, your consent must be obtained to participate. Your consent will be active for this visit and any virtual visit you may have with one of our providers in the next 365 days. If you have a MyChart account, a copy of this consent can be sent to you electronically.  As this is a virtual visit, video technology does not allow for your provider to perform a traditional examination. This may limit your provider's ability to fully assess your condition. If your provider identifies any concerns that need to be evaluated in person or the need to arrange testing (such as labs, EKG, etc.), we will make arrangements to do so. Although advances in technology are sophisticated, we cannot ensure that it will always work on either your end or our end. If the connection with a video visit is poor, the visit may have to be switched to a telephone visit. With either a video or telephone visit, we are not always able to ensure that we have a secure connection.  By engaging in this virtual visit, you consent to the provision of healthcare and authorize for your insurance to be billed (if applicable) for the services provided during this visit. Depending on your insurance coverage, you may receive a charge related to this service.  I need to obtain your verbal consent now. Are you willing to proceed with your visit today? Kripa Foskey Skaggs has provided verbal consent on 10/26/2022 for a virtual visit (video or telephone). Lenise Arena Ward, PA-C  Date: 10/26/2022 5:47 PM  Virtual Visit via Video Note   I, Lenise Arena Ward, connected with  Maria Mccarty  ([id number], 21-May-1971) on 10/26/22 at  5:30 PM EST by a video-enabled telemedicine application and verified that I am speaking with the correct person using two identifiers.  Location: Patient: Virtual Visit  Location Patient: Home Provider: Virtual Visit Location Provider: Office/Clinic   I discussed the limitations of evaluation and management by telemedicine and the availability of in person appointments. The patient expressed understanding and agreed to proceed.    History of Present Illness: Maria Mccarty is a 52 y.o. who identifies as a female who was assigned female at birth, and is being seen today for [place].  Her sx started yesterday.  She is experiencing body aches, headache, fever, congestion.  She reports h/o asthma, no issues in several years.  She is currently taking tyenol. Denies decreased appetite, drinking fluids ok.  She reports recurrent BV and has been experiencing vaginal discharge and vaginal irritation. She reports she was planning to call or OB for an appointment, but then tested positive for COVID. She reports today's sx feel similar to previous BV.  She states clinda orally has been the only thing to resolve sx.   HPI: HPI  Problems:  Patient Active Problem List   Diagnosis Date Noted   Cervical spondylosis 10/05/2022   Left lumbar radiculitis 08/24/2022   Hyponatremia 02/17/2022   GERD and IBS-D 02/16/2022   Autoimmune thyroiditis 01/05/2022   Constipation 01/05/2022   Flatulence, eructation and gas pain 01/05/2022   Odynophagia 01/05/2022   Pharyngeal dysphagia 01/05/2022   Anxiety, generalized 09/11/2020   Pelvic pain in female 06/19/2020   Essential hypertension 01/02/2020   Cold intolerance 01/02/2020   History of vitamin D deficiency 01/02/2020   Dysuria 01/02/2020   Dysphagia  Gastroesophageal reflux disease    Laryngopharyngeal reflux (LPR) 05/01/2018   Hyperlipidemia 02/23/2018   Gastroparesis 02/22/2018   Hypothyroidism (acquired) 02/22/2018   Chronic idiopathic constipation 12/21/2017   Right upper quadrant abdominal pain 12/19/2017   Cervical stenosis (uterine cervix) 12/21/2016   S/P LEEP 12/21/2016   Abnormal uterine bleeding (AUB)  11/03/2016   Ovarian cyst, right 09/09/2015   Non-rheumatic tricuspid valve insufficiency 06/17/2015    Allergies:  Allergies  Allergen Reactions   Penicillins Rash   Medications:  Current Outpatient Medications:    albuterol (VENTOLIN HFA) 108 (90 Base) MCG/ACT inhaler, Inhale 2 puffs into the lungs every 6 (six) hours as needed for wheezing or shortness of breath., Disp: 8 g, Rfl: 0   clindamycin (CLEOCIN) 300 MG capsule, Take 1 capsule (300 mg total) by mouth in the morning and at bedtime for 7 days., Disp: 14 capsule, Rfl: 0   molnupiravir EUA (LAGEVRIO) 200 mg CAPS capsule, Take 4 capsules (800 mg total) by mouth 2 (two) times daily for 5 days., Disp: 40 capsule, Rfl: 0   Cholecalciferol (VITAMIN D) 125 MCG (5000 UT) CAPS, Take 1 capsule by mouth daily., Disp: , Rfl:    hydrOXYzine (ATARAX) 10 MG tablet, Take 1 tablet (10 mg total) by mouth 3 (three) times daily as needed for anxiety., Disp: 90 tablet, Rfl: 1   linaclotide (LINZESS) 72 MCG capsule, Take 1 capsule (72 mcg total) by mouth daily before breakfast., Disp: 30 capsule, Rfl: 0   lisinopril (ZESTRIL) 10 MG tablet, Take 1 tablet (10 mg total) by mouth daily., Disp: 90 tablet, Rfl: 1   lisinopril-hydrochlorothiazide (ZESTORETIC) 20-12.5 MG tablet, Take 1 tablet by mouth daily., Disp: 90 tablet, Rfl: 1   NEXIUM 40 MG capsule, Take 1 capsule (40 mg total) by mouth daily as needed., Disp: 90 capsule, Rfl: 1   phenazopyridine (PYRIDIUM) 200 MG tablet, Take 1 tablet (200 mg total) by mouth 3 (three) times daily as needed., Disp: 30 tablet, Rfl: 1   predniSONE (DELTASONE) 50 MG tablet, One tab PO daily for 5 days., Disp: 5 tablet, Rfl: 0  Observations/Objective: Patient is well-developed, well-nourished in no acute distress.  Resting comfortably  at home.  Head is normocephalic, atraumatic.  No labored breathing.  Speech is clear and coherent with logical content.  Patient is alert and oriented at baseline.    Assessment and  Plan: 1. COVID - albuterol (VENTOLIN HFA) 108 (90 Base) MCG/ACT inhaler; Inhale 2 puffs into the lungs every 6 (six) hours as needed for wheezing or shortness of breath.  Dispense: 8 g; Refill: 0 - molnupiravir EUA (LAGEVRIO) 200 mg CAPS capsule; Take 4 capsules (800 mg total) by mouth 2 (two) times daily for 5 days.  Dispense: 40 capsule; Refill: 0  2. Bacterial vaginosis - clindamycin (CLEOCIN) 300 MG capsule; Take 1 capsule (300 mg total) by mouth in the morning and at bedtime for 7 days.  Dispense: 14 capsule; Refill: 0  In person visit precautions discussed   Follow Up Instructions: I discussed the assessment and treatment plan with the patient. The patient was provided an opportunity to ask questions and all were answered. The patient agreed with the plan and demonstrated an understanding of the instructions.  A copy of instructions were sent to the patient via MyChart unless otherwise noted below.     The patient was advised to call back or seek an in-person evaluation if the symptoms worsen or if the condition fails to improve as anticipated.  Time:  I spent 23 minutes with the patient via telehealth technology discussing the above problems/concerns.    Lenise Arena Ward, PA-C

## 2022-10-26 NOTE — Patient Instructions (Signed)
[facility] [place], thank you for joining [employer], PA-C for today's virtual visit.  While this provider is not your primary care provider (PCP), if your PCP is located in our provider database this encounter information will be shared with them immediately following your visit.   [facility] account gives you access to today's visit and all your visits, tests, and labs performed at [facility] " click here if you don't have a [facility] account or go to [email or web address]  Consent: (Patient) Maria Mccarty provided verbal consent for this virtual visit at the beginning of the encounter.  Current Medications:  Current Outpatient Medications:    albuterol (VENTOLIN HFA) 108 (90 Base) MCG/ACT inhaler, Inhale 2 puffs into the lungs every 6 (six) hours as needed for wheezing or shortness of breath., Disp: 8 g, Rfl: 0   clindamycin (CLEOCIN) 300 MG capsule, Take 1 capsule (300 mg total) by mouth in the morning and at bedtime for 7 days., Disp: 14 capsule, Rfl: 0   molnupiravir EUA (LAGEVRIO) 200 mg CAPS capsule, Take 4 capsules (800 mg total) by mouth 2 (two) times daily for 5 days., Disp: 40 capsule, Rfl: 0   Cholecalciferol (VITAMIN D) 125 MCG (5000 UT) CAPS, Take 1 capsule by mouth daily., Disp: , Rfl:    hydrOXYzine (ATARAX) 10 MG tablet, Take 1 tablet (10 mg total) by mouth 3 (three) times daily as needed for anxiety., Disp: 90 tablet, Rfl: 1   linaclotide (LINZESS) 72 MCG capsule, Take 1 capsule (72 mcg total) by mouth daily before breakfast., Disp: 30 capsule, Rfl: 0   lisinopril (ZESTRIL) 10 MG tablet, Take 1 tablet (10 mg total) by mouth daily., Disp: 90 tablet, Rfl: 1   lisinopril-hydrochlorothiazide (ZESTORETIC) 20-12.5 MG tablet, Take 1 tablet by mouth daily., Disp: 90 tablet, Rfl: 1   NEXIUM 40 MG capsule, Take 1 capsule (40 mg total) by mouth daily as needed., Disp: 90 capsule, Rfl: 1   phenazopyridine (PYRIDIUM) 200 MG tablet, Take 1  tablet (200 mg total) by mouth 3 (three) times daily as needed., Disp: 30 tablet, Rfl: 1   predniSONE (DELTASONE) 50 MG tablet, One tab PO daily for 5 days., Disp: 5 tablet, Rfl: 0   Medications ordered in this encounter:  Meds ordered this encounter  Medications   albuterol (VENTOLIN HFA) 108 (90 Base) MCG/ACT inhaler    Sig: Inhale 2 puffs into the lungs every 6 (six) hours as needed for wheezing or shortness of breath.    Dispense:  8 g    Refill:  0    Order Specific Question:   Supervising Provider    Answer:   Chase Picket [[id number]]   molnupiravir EUA (LAGEVRIO) 200 mg CAPS capsule    Sig: Take 4 capsules (800 mg total) by mouth 2 (two) times daily for 5 days.    Dispense:  40 capsule    Refill:  0    Order Specific Question:   Supervising Provider    Answer:   Chase Picket [[id number]]   clindamycin (CLEOCIN) 300 MG capsule    Sig: Take 1 capsule (300 mg total) by mouth in the morning and at bedtime for 7 days.    Dispense:  14 capsule    Refill:  0    Order Specific Question:   Supervising Provider    Answer:   Chase Picket [id number]     *If you need refills on other medications prior to your next appointment,  please contact your pharmacy*  Follow-Up: Call back or seek an in-person evaluation if the symptoms worsen or if the condition fails to improve as anticipated.  [place] [phone]  Other Instructions Use albuterol inhaler as needed for shortness of breath or wheezing.  Take Molnupiravir for COVID as prescribed.  Take Clindamycin as prescribed for bacterial vaginosis.  If you are experiencing new or worsening symptoms follow up with PCP or Urgent Care for an in person visit.   If you have been instructed to have an in-person evaluation today at a local Urgent Care facility, please use the link below. It will take you to a list of all of our available [facility], including address, phone number and hours of operation.  Please do not delay care.  [facility]  If you or a family member do not have a primary care provider, use the link below to schedule a visit and establish care. When you choose a [facility] primary care physician or advanced practice provider, you gain a long-term partner in health. Find a Primary Care Provider  Learn more about [facility]'s in-office and virtual care options: [facility]

## 2022-10-27 ENCOUNTER — Other Ambulatory Visit: Payer: Self-pay

## 2022-10-27 ENCOUNTER — Encounter (HOSPITAL_BASED_OUTPATIENT_CLINIC_OR_DEPARTMENT_OTHER): Payer: Self-pay

## 2022-10-27 ENCOUNTER — Emergency Department (HOSPITAL_BASED_OUTPATIENT_CLINIC_OR_DEPARTMENT_OTHER)
Admission: EM | Admit: 2022-10-27 | Discharge: 2022-10-27 | Disposition: A | Discharge status: Home / Self Care | Payer: Commercial Managed Care - HMO | Attending: Emergency Medicine | Admitting: Emergency Medicine

## 2022-10-27 DIAGNOSIS — U071 COVID-19: Secondary | ICD-10-CM | POA: Insufficient documentation

## 2022-10-27 DIAGNOSIS — E871 Hypo-osmolality and hyponatremia: Secondary | ICD-10-CM | POA: Insufficient documentation

## 2022-10-27 DIAGNOSIS — I10 Essential (primary) hypertension: Secondary | ICD-10-CM | POA: Diagnosis not present

## 2022-10-27 DIAGNOSIS — Z79899 Other long term (current) drug therapy: Secondary | ICD-10-CM | POA: Insufficient documentation

## 2022-10-27 DIAGNOSIS — E876 Hypokalemia: Secondary | ICD-10-CM | POA: Insufficient documentation

## 2022-10-27 DIAGNOSIS — R509 Fever, unspecified: Secondary | ICD-10-CM | POA: Diagnosis present

## 2022-10-27 LAB — COMPREHENSIVE METABOLIC PANEL
ALT: 23 U/L (ref 0–44)
AST: 28 U/L (ref 15–41)
Albumin: 4.2 g/dL (ref 3.5–5.0)
Alkaline Phosphatase: 54 U/L (ref 38–126)
Anion gap: 11 (ref 5–15)
BUN: 7 mg/dL (ref 6–20)
CO2: 28 mmol/L (ref 22–32)
Calcium: 8.6 mg/dL — ABNORMAL LOW (ref 8.9–10.3)
Chloride: 88 mmol/L — ABNORMAL LOW (ref 98–111)
Creatinine, Ser: 0.52 mg/dL (ref 0.44–1.00)
GFR, Estimated: >60 mL/min (ref >60)
Glucose, Bld: 100 mg/dL — ABNORMAL HIGH (ref 70–99)
Potassium: 3.1 mmol/L — ABNORMAL LOW (ref 3.5–5.1)
Sodium: 127 mmol/L — ABNORMAL LOW (ref 135–145)
Total Bilirubin: 0.7 mg/dL (ref 0.3–1.2)
Total Protein: 7.6 g/dL (ref 6.5–8.1)

## 2022-10-27 MED ORDER — SODIUM CHLORIDE 0.9 % IV BOLUS
1000.0000 mL | Freq: Once | INTRAVENOUS | Status: AC
  Administered 2022-10-27: 1000 mL via INTRAVENOUS

## 2022-10-27 MED ORDER — PAXLOVID (300/100) 20 X 150 MG & 10 X 100MG PO TBPK: 3.0000 | ORAL_TABLET | Freq: Two times a day (BID) | ORAL | 0 refills | Status: AC

## 2022-10-27 MED ORDER — POTASSIUM CHLORIDE CRYS ER 20 MEQ PO TBCR
40.0000 meq | EXTENDED_RELEASE_TABLET | Freq: Once | ORAL | Status: AC
  Administered 2022-10-27: 40 meq via ORAL
  Filled 2022-10-27: qty 2

## 2022-10-27 NOTE — ED Triage Notes (Signed)
Pt reports she tested positive for covid on Monday and has not been able to see her PCP so she had a [facility] virtual visit and was prescribed a medication that was $1500. The pharmacist would not prescribe "the other medication" without her having blood work first. She is here today "to get covid medication so I dont get worse." She reports body aches, runny nose and low grade fevers at home. Denies shortness of breath or CP.

## 2022-10-27 NOTE — ED Notes (Signed)
Discharge paperwork reviewed entirely with patient, including Rx's and follow up care. Pain was under control. Pt verbalized understanding as well as all parties involved. No questions or concerns voiced at the time of discharge. No acute distress noted.   Pt ambulated out to PVA without incident or assistance.  

## 2022-10-27 NOTE — ED Provider Notes (Signed)
[facility] Provider Note   CSN: [id number] Arrival date & time: 10/27/22  1525     History  Chief Complaint  Patient presents with   Medication Refill    Maria Mccarty is a 52 y.o. female, history of hypertension, who presents to the ED secondary to needing blood work done so that she can get a COVID medication.  She states she was diagnosed with COVID on Monday given that her symptoms started Monday, only complains of low-grade fever, body aches, and chills.  Overall is doing well, however called her primary care and was told to do a virtual visit.  Was prescribed a COVID medication that cost around $1500, and was told that she could not get the other COVID medication Paxlovid unless she got blood work done.  She is here so she get blood work done and get a Paxlovid prescription.  She denies any shortness of breath cough nausea or vomiting.    Home Medications Prior to Admission medications   Medication Sig Start Date End Date Taking? Authorizing Provider  nirmatrelvir & ritonavir (PAXLOVID, 300/100,) 20 x 150 MG & 10 x '100MG'$  TBPK Take 3 tablets by mouth 2 (two) times daily for 5 days. 10/27/22 11/01/22 Yes Lesslie Mossa L, PA  albuterol (VENTOLIN HFA) 108 (90 Base) MCG/ACT inhaler Inhale 2 puffs into the lungs every 6 (six) hours as needed for wheezing or shortness of breath. 10/26/22   Ward, Lenise Arena, PA-C  Cholecalciferol (VITAMIN D) 125 MCG (5000 UT) CAPS Take 1 capsule by mouth daily.    [provider]  clindamycin (CLEOCIN) 300 MG capsule Take 1 capsule (300 mg total) by mouth in the morning and at bedtime for 7 days. 10/26/22 11/02/22  Ward, Lenise Arena, PA-C  hydrOXYzine (ATARAX) 10 MG tablet Take 1 tablet (10 mg total) by mouth 3 (three) times daily as needed for anxiety. 01/05/22   [name], NP  linaclotide [facility]) 72 MCG capsule Take 1 capsule (72 mcg total) by mouth daily before breakfast. 02/24/22   Silverio Decamp, MD  lisinopril (ZESTRIL) 10 MG tablet Take 1 tablet (10 mg total) by mouth daily. 08/24/22   [name], NP  lisinopril-hydrochlorothiazide (ZESTORETIC) 20-12.5 MG tablet Take 1 tablet by mouth daily. 07/19/22   [name], NP  NEXIUM 40 MG capsule Take 1 capsule (40 mg total) by mouth daily as needed. 07/07/22   [name], NP  phenazopyridine (PYRIDIUM) 200 MG tablet Take 1 tablet (200 mg total) by mouth 3 (three) times daily as needed. 06/26/20   [name], NP  predniSONE (DELTASONE) 50 MG tablet One tab PO daily for 5 days. 08/24/22   Silverio Decamp, MD      Allergies    Penicillins    Review of Systems   Review of Systems  Constitutional:  Positive for chills and fever.  Respiratory:  Negative for cough and shortness of breath.     Physical Exam Updated Vital Signs BP (!) 150/91 (BP Location: Right Arm)   Pulse 87   Temp 100.2 F (37.9 C) (Oral)   Resp 18   Ht '5\' 3"'$  (1.6 m)   Wt 59 kg   LMP 12/05/2017 (Exact Date)   SpO2 99%   BMI 23.03 kg/m  Physical Exam Vitals and nursing note reviewed.  Constitutional:      General: She is not in acute distress.    Appearance: She is well-developed.  HENT:  Head: Normocephalic and atraumatic.  Eyes:     Conjunctiva/sclera: Conjunctivae normal.  Cardiovascular:     Rate and Rhythm: Normal rate and regular rhythm.     Heart sounds: No murmur heard. Pulmonary:     Effort: Pulmonary effort is normal. No respiratory distress.     Breath sounds: Normal breath sounds.  Abdominal:     Palpations: Abdomen is soft.     Tenderness: There is no abdominal tenderness.  Musculoskeletal:        General: No swelling.     Cervical back: Neck supple.  Skin:    General: Skin is warm and dry.     Capillary Refill: Capillary refill takes less than 2 seconds.  Neurological:     Mental Status: She is alert.  Psychiatric:        Mood and Affect: Mood normal.     ED Results / Procedures / Treatments   Labs (all  labs ordered are listed, but only abnormal results are displayed) Labs Reviewed  COMPREHENSIVE METABOLIC PANEL - Abnormal; Notable for the following components:      Result Value   Sodium 127 (*)    Potassium 3.1 (*)    Chloride 88 (*)    Glucose, Bld 100 (*)    Calcium 8.6 (*)    All other components within normal limits    EKG None  Radiology No results found.  Procedures Procedures    Medications Ordered in ED Medications  sodium chloride 0.9 % bolus 1,000 mL (1,000 mLs Intravenous New Bag/Given 10/27/22 1832)  potassium chloride SA (KLOR-CON M) CR tablet 40 mEq (40 mEq Oral Given 10/27/22 1842)    ED Course/ Medical Decision Making/ A&P                             Medical Decision Making Patient is a 52 year old female, here for Paxlovid prescription for her COVID diagnosis on Monday, and then requesting blood work so she can get the Paxlovid prescription.  We discussed that this is possible, however she is not high risk for COVID, she would still like to obtain blood work, and get Paxlovid prescription.  Voiced understanding of the adverse effects of Paxlovid, and medications evaluated, her blood pressure is mild high risk for hypokalemia, we discussed follow-up with primary care afterwards to make sure her electrolytes are fine.  CMP ordered to evaluate for LFTs and creatinine.  Amount and/or Complexity of Data Reviewed Labs: ordered.    Details: Hyponatremic at 127, K of 3.1 Discussion of management or test interpretation with external provider(s): Discussed with patient, hyponatremia, need for follow-up with primary care doctor, gave her IV fluids, likely secondary to poor p.o. intake.  K of 3.1, we discussed the need for follow-up with primary care doctor, potassium replaced here.  We discussed also Paxlovid side effects associated with it, including hypokalemia given her medications.  She was in agreement with plan was going to follow-up with her primary care doctor in 1  week.  Return precautions emphasized.  Risk Prescription drug management.   Final Clinical Impression(s) / ED Diagnoses Final diagnoses:  COVID-19  Hyponatremia  Hypokalemia    Rx / DC Orders ED Discharge Orders          Ordered    nirmatrelvir & ritonavir (PAXLOVID, 300/100,) 20 x 150 MG & 10 x '100MG'$  TBPK  2 times daily        10/27/22 1847  Osvaldo Shipper, PA 10/27/22 [place], [place], DO 10/27/22 1924

## 2022-10-27 NOTE — Discharge Instructions (Addendum)
Please follow-up with your primary care doctor, for repeat labs.  Paxlovid can cause increased risk of hypokalemia, so you will need to have a follow-up in about a week to have your electrolytes checked.  Please make sure you are drinking lots of fluids, and resting.  If you develop severe shortness of breath, confusion, intractable nausea or vomiting please return to the ER.

## 2022-10-29 ENCOUNTER — Telehealth: Payer: Self-pay | Admitting: General Practice

## 2022-10-29 NOTE — Telephone Encounter (Signed)
Transition Care Management Unsuccessful Follow-up Telephone Call  Date of discharge and from where:  10/27/22 from [facility]  Attempts:  1st Attempt  Reason for unsuccessful TCM follow-up call:  Left voice message

## 2022-11-01 NOTE — Telephone Encounter (Signed)
Transition Care Management Follow-up Telephone Call Date of discharge and from where: 10/27/22 from [facility] How have you been since you were released from the hospital? Doing ok. Has been drinking gatorade to help with the electrolytes. Scheduled to see Iran Planas for a three day follow up. PCP not in office. Any questions or concerns? No  Items Reviewed: Did the pt receive and understand the discharge instructions provided? Yes  Medications obtained and verified? Yes  Other? No  Any new allergies since your discharge? No  Dietary orders reviewed? Yes Do you have support at home? Yes   Home Care and Equipment/Supplies: Were home health services ordered? no   Functional Questionnaire: (I = Independent and D = Dependent) ADLs: I  Bathing/Dressing- I  Meal Prep- I  Eating- I  Maintaining continence- I  Transferring/Ambulation- I  Managing Meds- I  Follow up appointments reviewed:  PCP Hospital f/u appt confirmed? Yes  Scheduled to see Iran Planas, PA on 11/02/22 @ 2pm. [facility] f/u appt confirmed? No  Are transportation arrangements needed? No  If their condition worsens, is the pt aware to call PCP or go to the Emergency Dept.? Yes Was the patient provided with contact information for the PCP's office or ED? Yes Was to pt encouraged to call back with questions or concerns? Yes

## 2022-11-02 ENCOUNTER — Ambulatory Visit (INDEPENDENT_AMBULATORY_CARE_PROVIDER_SITE_OTHER): Payer: Commercial Managed Care - HMO | Admitting: Physician Assistant

## 2022-11-02 VITALS — BP 163/82 | HR 70 | Ht 63.0 in | Wt 137.0 lb

## 2022-11-02 DIAGNOSIS — E871 Hypo-osmolality and hyponatremia: Secondary | ICD-10-CM

## 2022-11-02 DIAGNOSIS — E876 Hypokalemia: Secondary | ICD-10-CM

## 2022-11-02 DIAGNOSIS — Z79899 Other long term (current) drug therapy: Secondary | ICD-10-CM

## 2022-11-02 DIAGNOSIS — I10 Essential (primary) hypertension: Secondary | ICD-10-CM

## 2022-11-02 DIAGNOSIS — Z8616 Personal history of COVID-19: Secondary | ICD-10-CM

## 2022-11-02 DIAGNOSIS — M25552 Pain in left hip: Secondary | ICD-10-CM

## 2022-11-02 NOTE — Progress Notes (Signed)
Established Patient Office Visit  Subjective   Patient ID: Maria Mccarty, female    DOB: [birth year]  Age: 52 y.o. MRN: [id number]  Chief Complaint  Patient presents with   Hospitalization Follow-up    HPI Pt is a 52 yo female who presents to the clinic for hospital follow up. Pt has history of HTN and needed blood work to get paxlovid for covid 19 infection.  She went to ED on 10/27/2022. She had labs done in ED and had hyponateremia at 127, hypokalemia at 3.1.  she was given IV fluids and told to follow up in 1 week.   She continues to have problems with left lumbar radiculitis. Her MRI was normal and showed no reason for numbness and tingling. She has not done any PT or gabapentin or lyrica. Symptoms are intermittent. No strength changes. Symptoms worry her a lot.  Active Ambulatory Problems    Diagnosis Date Noted   Abnormal uterine bleeding (AUB) 11/03/2016   Dysphagia    Gastroesophageal reflux disease    Essential hypertension 01/02/2020   Cold intolerance 01/02/2020   History of vitamin D deficiency 01/02/2020   Dysuria 01/02/2020   Pelvic pain in female 06/19/2020   Anxiety, generalized 09/11/2020   Autoimmune thyroiditis 01/05/2022   Cervical stenosis (uterine cervix) 12/21/2016   Chronic idiopathic constipation 12/21/2017   Constipation 01/05/2022   Flatulence, eructation and gas pain 01/05/2022   Gastroparesis 02/22/2018   Hyperlipidemia 02/23/2018   Hypothyroidism (acquired) 02/22/2018   Laryngopharyngeal reflux (LPR) 05/01/2018   Non-rheumatic tricuspid valve insufficiency 06/17/2015   Odynophagia 01/05/2022   Ovarian cyst, right 09/09/2015   Pharyngeal dysphagia 01/05/2022   Right upper quadrant abdominal pain 12/19/2017   S/P LEEP 12/21/2016   GERD and IBS-D 02/16/2022   Hyponatremia 02/17/2022   Left lumbar radiculitis 08/24/2022   Cervical spondylosis 10/05/2022   Left hip pain 11/08/2022   Resolved Ambulatory Problems    Diagnosis Date Noted    Preventative health care 01/02/2020   Bacterial sinusitis 01/05/2022   Gastroesophageal reflux disease with esophagitis 12/21/2017   Past Medical History:  Diagnosis Date   Acid reflux    Hypertension    Vaginal Pap smear, abnormal     ROS See HPI.    Objective:     BP (!) 163/82   Pulse 70   Ht '5\' 3"'$  (1.6 m)   Wt 137 lb (62.1 kg)   LMP 12/05/2017 (Exact Date)   SpO2 100%   BMI 24.27 kg/m  BP Readings from Last 3 Encounters:  11/02/22 (!) 163/82  10/27/22 (!) 141/85  07/07/22 133/77   Wt Readings from Last 3 Encounters:  11/02/22 137 lb (62.1 kg)  10/27/22 130 lb (59 kg)  07/07/22 132 lb 14.4 oz (60.3 kg)      Physical Exam Constitutional:      Appearance: Normal appearance.  HENT:     Head: Normocephalic.  Cardiovascular:     Rate and Rhythm: Normal rate.  Pulmonary:     Effort: Pulmonary effort is normal.  Neurological:     General: No focal deficit present.     Mental Status: She is alert and oriented to person, place, and time.  Psychiatric:        Mood and Affect: Mood normal.        Assessment & Plan:  Marland KitchenMarland KitchenSreeja was seen today for hospitalization follow-up.  Diagnoses and all orders for this visit:  History of COVID-19  Medication management -     COMPLETE  METABOLIC PANEL WITH GFR -     Osmolality -     Osmolality, urine -     Microalbumin / creatinine urine ratio -     Sodium, urine, random -     TSH -     Sed Rate (ESR)  Hypokalemia -     Osmolality -     Osmolality, urine -     Microalbumin / creatinine urine ratio -     Sodium, urine, random -     TSH -     Sed Rate (ESR)  Hyponatremia -     Osmolality -     Osmolality, urine -     Microalbumin / creatinine urine ratio -     Sodium, urine, random -     TSH -     Sed Rate (ESR)  Left hip pain -     Sodium, urine, random -     TSH -     Sed Rate (ESR)  Essential hypertension   BP not to goal Recheck in 1-2 weeks nurse visit Labs ordered to recheck electrolytes  and hyponatremia Continue to stay hydrated Follow up as needed if symptoms persist or worsen See PCP in the next 1-2 weeks    Iran Planas, PA-C

## 2022-11-02 NOTE — Patient Instructions (Signed)
Would like 2nd opinion   Hip Bursitis Rehab Ask your health care provider which exercises are safe for you. Do exercises exactly as told by your health care provider and adjust them as directed. It is normal to feel mild stretching, pulling, tightness, or discomfort as you do these exercises. Stop right away if you feel sudden pain or your pain gets worse. Do not begin these exercises until told by your health care provider. Stretching exercise This exercise warms up your muscles and joints and improves the movement and flexibility of your hip. This exercise also helps to relieve pain and stiffness. Iliotibial band stretch An iliotibial band is a strong band of muscle tissue that runs from the outer side of your hip to the outer side of your thigh and knee. Lie on your side with your left / right leg in the top position. Bend your left / right knee and grab your ankle. Stretch out your bottom arm to help you balance. Slowly bring your knee back so your thigh is slightly behind your body. Slowly lower your knee toward the floor until you feel a gentle stretch on the outside of your left / right thigh. If you do not feel a stretch and your knee will not lower more toward the floor, place the heel of your other foot on top of your knee and pull your knee down toward the floor with your foot. Hold this position for __________ seconds. Slowly return to the starting position. Repeat __________ times. Complete this exercise __________ times a day. Strengthening exercises These exercises build strength and endurance in your hip and pelvis. Endurance is the ability to use your muscles for a long time, even after they get tired. Bridge This exercise strengthens the muscles that move your thigh backward (hip extensors). Lie on your back on a firm surface with your knees bent and your feet flat on the floor. Tighten your buttocks muscles and lift your buttocks off the floor until your trunk is level with  your thighs. Do not arch your back. You should feel the muscles working in your buttocks and the back of your thighs. If you do not feel these muscles, slide your feet 1-2 inches (2.5-5 cm) farther away from your buttocks. If this exercise is too easy, try doing it with your arms crossed over your chest. Hold this position for __________ seconds. Slowly lower your hips to the starting position. Let your muscles relax completely after each repetition. Repeat __________ times. Complete this exercise __________ times a day. Squats This exercise strengthens the muscles in front of your thigh and knee (quadriceps). Stand in front of a table, with your feet and knees pointing straight ahead. You may rest your hands on the table for balance but not for support. Slowly bend your knees and lower your hips like you are going to sit in a chair. Keep your weight over your heels, not over your toes. Keep your lower legs upright so they are parallel with the table legs. Do not let your hips go lower than your knees. Do not bend lower than told by your health care provider. If your hip pain increases, do not bend as low. Hold the squat position for __________ seconds. Slowly push with your legs to return to standing. Do not use your hands to pull yourself to standing. Repeat __________ times. Complete this exercise __________ times a day. Hip hike  Stand sideways on a bottom step. Stand on your left / right leg with  your other foot unsupported next to the step. You can hold on to the railing or wall for balance if needed. Keep your knees straight and your torso square. Then lift your left / right hip up toward the ceiling. Hold this position for __________ seconds. Slowly let your left / right hip lower toward the floor, past the starting position. Your foot should get closer to the floor. Do not lean or bend your knees. Repeat __________ times. Complete this exercise __________ times a day. Single leg  stand This exercise increases your balance. Without shoes, stand near a railing or in a doorway. You may hold on to the railing or door frame as needed for balance. Squeeze your left / right buttock muscles, then lift up your other foot. Do not let your left / right hip push out to the side. It is helpful to stand in front of a mirror for this exercise so you can watch your hip. Hold this position for __________ seconds. Repeat __________ times. Complete this exercise __________ times a day. This information is not intended to replace advice given to you by your health care provider. Make sure you discuss any questions you have with your health care provider. Document Revised: 09/02/2021 Document Reviewed: 09/02/2021 Elsevier Patient Education  [place].

## 2022-11-03 LAB — MICROALBUMIN / CREATININE URINE RATIO
Creatinine, Urine: 19 mg/dL — ABNORMAL LOW (ref 20–275)
Microalb, Ur: <0.2 mg/dL

## 2022-11-03 LAB — TSH: TSH: 2.77 mIU/L

## 2022-11-03 LAB — COMPLETE METABOLIC PANEL WITH GFR
AG Ratio: 1.5 (calc) (ref 1.0–2.5)
ALT: 17 U/L (ref 6–29)
AST: 16 U/L (ref 10–35)
Albumin: 4.5 g/dL (ref 3.6–5.1)
Alkaline phosphatase (APISO): 62 U/L (ref 37–153)
BUN: 7 mg/dL (ref 7–25)
CO2: 32 mmol/L (ref 20–32)
Calcium: 9.7 mg/dL (ref 8.6–10.4)
Chloride: 90 mmol/L — ABNORMAL LOW (ref 98–110)
Creat: 0.55 mg/dL (ref 0.50–1.03)
Globulin: 3.1 g/dL (calc) (ref 1.9–3.7)
Glucose, Bld: 97 mg/dL (ref 65–99)
Potassium: 3.8 mmol/L (ref 3.5–5.3)
Sodium: 131 mmol/L — ABNORMAL LOW (ref 135–146)
Total Bilirubin: 0.5 mg/dL (ref 0.2–1.2)
Total Protein: 7.6 g/dL (ref 6.1–8.1)
eGFR: 110 mL/min/{1.73_m2} (ref >=60)

## 2022-11-03 LAB — OSMOLALITY, URINE: Osmolality, Ur: 185 mOsm/kg (ref 50–1200)

## 2022-11-03 LAB — SODIUM, URINE, RANDOM: Sodium, Ur: 51 mmol/L (ref 28–272)

## 2022-11-03 LAB — OSMOLALITY: Osmolality: 273 mOsm/kg — ABNORMAL LOW (ref 278–305)

## 2022-11-03 LAB — SEDIMENTATION RATE: Sed Rate: 17 mm/h (ref 0–30)

## 2022-11-03 NOTE — Progress Notes (Signed)
When you get a chance lets chat about patient and labs.

## 2022-11-03 NOTE — Progress Notes (Signed)
Sodium did improve but still low. I am wondering if low sodium could be your blood pressure medications. They can cause low sodium. I will discuss with Joy and get back with you.

## 2022-11-05 DIAGNOSIS — J019 Acute sinusitis, unspecified: Secondary | ICD-10-CM | POA: Diagnosis not present

## 2022-11-05 NOTE — Progress Notes (Signed)
Discussed with JOy and she wants to follow up with you and discuss labs. Make sure not over hydrating and consider a little increase in salt.

## 2022-11-08 ENCOUNTER — Encounter: Payer: Self-pay | Admitting: Physician Assistant

## 2022-11-08 DIAGNOSIS — M25552 Pain in left hip: Secondary | ICD-10-CM | POA: Insufficient documentation

## 2022-11-19 ENCOUNTER — Telehealth: Payer: Self-pay | Admitting: Medical-Surgical

## 2022-11-19 NOTE — Telephone Encounter (Signed)
Is this okay with you?

## 2022-11-19 NOTE — Telephone Encounter (Signed)
Pt would like to [facility] for the sake of distance.

## 2022-12-06 ENCOUNTER — Encounter: Payer: Self-pay | Admitting: Family Medicine

## 2022-12-06 ENCOUNTER — Ambulatory Visit: Payer: Medicaid Other | Admitting: Family Medicine

## 2022-12-06 VITALS — BP 138/70 | HR 73 | Temp 97.6°F | Ht 63.0 in | Wt 139.6 lb

## 2022-12-06 DIAGNOSIS — F411 Generalized anxiety disorder: Secondary | ICD-10-CM | POA: Diagnosis not present

## 2022-12-06 DIAGNOSIS — E871 Hypo-osmolality and hyponatremia: Secondary | ICD-10-CM | POA: Diagnosis not present

## 2022-12-06 DIAGNOSIS — M5432 Sciatica, left side: Secondary | ICD-10-CM | POA: Diagnosis not present

## 2022-12-06 DIAGNOSIS — I10 Essential (primary) hypertension: Secondary | ICD-10-CM | POA: Diagnosis not present

## 2022-12-06 DIAGNOSIS — D259 Leiomyoma of uterus, unspecified: Secondary | ICD-10-CM | POA: Insufficient documentation

## 2022-12-06 DIAGNOSIS — Z23 Encounter for immunization: Secondary | ICD-10-CM | POA: Diagnosis not present

## 2022-12-06 MED ORDER — LISINOPRIL 40 MG PO TABS: 40.0000 mg | ORAL_TABLET | Freq: Every day | ORAL | 3 refills | Status: DC

## 2022-12-06 MED ORDER — HYDROXYZINE HCL 10 MG PO TABS: 10.0000 mg | ORAL_TABLET | Freq: Three times a day (TID) | ORAL | 1 refills | Status: DC | PRN

## 2022-12-06 NOTE — Assessment & Plan Note (Addendum)
Maria Mccarty has had hyponatremia since 2021. This did occur after she started on HCTZ. I will switch her off of the diuretic and plan to have her back to reassess her sodium in 2 weeks. If it remains low, we will readdress an evaluation for a cause.

## 2022-12-06 NOTE — Assessment & Plan Note (Signed)
Stable on hydroxyzine.

## 2022-12-06 NOTE — Assessment & Plan Note (Signed)
The history and exam are consistent with a sciatica rather than a radiculitis. I would consider meralgia paresthetica, but the radiation pattern is not consistent. I will avoid an NSAID for now, in light of the high blood pressure. I recommend we try a trial of physical therapy.

## 2022-12-06 NOTE — Assessment & Plan Note (Signed)
Blood pressure is mildly elevated today. Due to hyponatremia, I recommend stopping the HCTZ. I will switch her to lisinopril 40 mg daily.

## 2022-12-06 NOTE — Progress Notes (Signed)
[facility] [address] [place] [address] Dept: [phone] Dept Fax: [phone]  Transfer of Care Office Visit  Subjective:    Patient ID: Saintclair Halsted, female    DOB: [birth year], 52 y.o..   MRN: [id number]  Chief Complaint  Patient presents with   Establish Care    NP- establish care.   C/o having low sodium level and also  having pain in low back/leg.     History of Present Illness:  Patient is in today to establish care. Ms. Weitzman was born in [place]. She grew up in [place]. She attended [employer], completing a certification for substitute teaching. She attended other schools such as [employer] and had certification as an Investment banker, operational and as a Mudlogger. She has not been working for the past 2 years to give care to her SO, who has had a complicated recovery from a femur fracture. She and her SO have been together for 20 years. She has three daughters (42, 73, 43). She has two grandchildren. She denies use of tobacco, alcohol, or drugs.  Ms. Epperson has a history of hypertension. She is currently managed on Zestoretic 20-12.5 mg q am and lisinopril 10 mg q pm. She notes she is having issues with hyponatremia. She feels frustrated, as she notes she had no evaluation of this. She was told to limit her water intake and increase her salt intake.    Ms. Mendel Ryder has a history of a pain in her left leg. She notes this started in Oct. after she made a twisting movement. She finds she has pain that is burning and pin-prick in quality in the posterolateral left thigh. She has the pain radiate down to her foot and up to her SI joint/lower back area at times. She notes she had a recent MRI scan and was told this was normal. she feels she has not been offered other options for management.  Ms. Metzner has a history of anxiety. She manages this with hydroxyzine, which she takes only 3-4 times a month.  Ms.  Lucci has a history of GERD. She uses Nexium as needed. She notes she had a past issue with gastroparesis and dysphagia. At the time this became an issue, she had some significant weight loss. She has also had chronic constipation. There have been past concerns for possible IBS. She currently uses Metamucil and Colace to help manage this.   Past Medical History: Patient Active Problem List   Diagnosis Date Noted   Left hip pain 11/08/2022   Cervical spondylosis 10/05/2022   Left lumbar radiculitis 08/24/2022   Hyponatremia 02/17/2022   Autoimmune thyroiditis 01/05/2022   Odynophagia 01/05/2022   Pharyngeal dysphagia 01/05/2022   Anxiety, generalized 09/11/2020   Essential hypertension 01/02/2020   History of vitamin D deficiency 01/02/2020   Dysphagia    Gastroesophageal reflux disease    Laryngopharyngeal reflux (LPR) 05/01/2018   Hyperlipidemia 02/23/2018   Gastroparesis 02/22/2018   Hypothyroidism (acquired) 02/22/2018   Chronic idiopathic constipation 12/21/2017   S/P LEEP 12/21/2016   Ovarian cyst, right 09/09/2015   Non-rheumatic tricuspid valve insufficiency 06/17/2015   Past Surgical History:  Procedure Laterality Date   27 HOUR [place] STUDY N/A 04/24/2018   Procedure: 24 HOUR PH STUDY;  Surgeon: Mauri Pole, MD;  Location: [facility];  Service: Endoscopy;  Laterality: N/A;   CERVICAL BIOPSY  W/ LOOP ELECTRODE EXCISION     COLPOSCOPY     ESOPHAGEAL MANOMETRY  N/A 04/24/2018   Procedure: ESOPHAGEAL MANOMETRY (EM);  Surgeon: Mauri Pole, MD;  Location: [facility];  Service: Endoscopy;  Laterality: N/A;   LEEP     [place] IMPEDANCE STUDY  04/24/2018   Procedure: [place] IMPEDANCE STUDY;  Surgeon: Mauri Pole, MD;  Location: [facility];  Service: Endoscopy;;   TONSILLECTOMY     Family History  Problem Relation Age of Onset   Cancer Mother        Colon   Alcohol abuse Mother    Hypertension Mother    Peripheral Artery Disease Mother    Cancer Brother         Kidney   Heart disease Brother    Hypertension Brother    Hypertension Brother    Stroke Paternal Grandmother    Diabetes Paternal Grandfather    Outpatient Medications Prior to Visit  Medication Sig Dispense Refill   Cholecalciferol (VITAMIN D) 125 MCG (5000 UT) CAPS Take 1 capsule by mouth daily.     NEXIUM 40 MG capsule Take 1 capsule (40 mg total) by mouth daily as needed. 90 capsule 1   hydrOXYzine (ATARAX) 10 MG tablet Take 1 tablet (10 mg total) by mouth 3 (three) times daily as needed for anxiety. 90 tablet 1   lisinopril (ZESTRIL) 10 MG tablet Take 1 tablet (10 mg total) by mouth daily. (Patient taking differently: Take 10 mg by mouth daily. In evening.) 90 tablet 1   lisinopril-hydrochlorothiazide (ZESTORETIC) 20-12.5 MG tablet Take 1 tablet by mouth daily. 90 tablet 1   No facility-administered medications prior to visit.   Allergies  Allergen Reactions   Penicillins Rash      Objective:   Today's Vitals   12/06/22 1418  BP: 138/70  Pulse: 73  Temp: 97.6 F (36.4 C)  TempSrc: Temporal  SpO2: 96%  Weight: 139 lb 9.6 oz (63.3 kg)  Height: '5\' 3"'$  (1.6 m)   Body mass index is 24.73 kg/m.   General: Well developed, well nourished. No acute distress. Back: Straight. No pain on palpation over the lower back. Extremities: Full ROM. No increased pain with palpation over the posterolateral left thigh. Strength 5/5. DTR 2+ bilaterally. Psych: Alert and oriented. Normal mood and affect.  Health Maintenance Due  Topic Date Due   HIV Screening  Never done   Hepatitis C Screening  Never done   DTaP/Tdap/Td (1 - Tdap) Never done   Zoster Vaccines- Shingrix (1 of 2) Never done   Lab Results    Latest Ref Rng & Units 11/02/2022    2:39 PM 10/27/2022    5:35 PM 07/07/2022   12:00 AM  CMP  Glucose 65 - 99 mg/dL 97  100  87   BUN 7 - 25 mg/dL '7  7  8   '$ Creatinine 0.50 - 1.03 mg/dL 0.55  0.52  0.55   Sodium 135 - 146 mmol/L 131  127  132   Potassium 3.5 - 5.3 mmol/L  3.8  3.1  4.3   Chloride 98 - 110 mmol/L 90  88  93   CO2 20 - 32 mmol/L 32  28  31   Calcium 8.6 - 10.4 mg/dL 9.7  8.6  9.3   Total Protein 6.1 - 8.1 g/dL 7.6  7.6  7.1   Total Bilirubin 0.2 - 1.2 mg/dL 0.5  0.7  0.5   Alkaline Phos 38 - 126 U/L  54    AST 10 - 35 U/L 16  28  18  ALT 6 - 29 U/L '17  23  14    '$ Imaging: MRI Lumbar Spine (10/19/2022) IMPRESSION: 1. No significant lumbar spine disc protrusion, foraminal stenosis or central canal stenosis. 2. No acute osseous injury of the lumbar spine.  Assessment & Plan:   Problem List Items Addressed This Visit       Cardiovascular and Mediastinum   Essential hypertension - Primary    Blood pressure is mildly elevated today. Due to hyponatremia, I recommend stopping the HCTZ. I will switch her to lisinopril 40 mg daily.      Relevant Medications   lisinopril (ZESTRIL) 40 MG tablet     Nervous and Auditory   Left sided sciatica    The history and exam are consistent with a sciatica rather than a radiculitis. I would consider meralgia paresthetica, but the radiation pattern is not consistent. I will avoid an NSAID for now, in light of the high blood pressure. I recommend we try a trial of physical therapy.       Relevant Medications   hydrOXYzine (ATARAX) 10 MG tablet   Other Relevant Orders   Ambulatory referral to Physical Therapy     Other   Anxiety, generalized   Relevant Medications   hydrOXYzine (ATARAX) 10 MG tablet   Hyponatremia    Ms. Littleton has had hyponatremia since 2021. This did occur after she started on HCTZ. I will switch her off of the diuretic and plan to have her back to reassess her sodium in 2 weeks. If it remains low, we will readdress an evaluation for a cause.      Other Visit Diagnoses     Need for Tdap vaccination       Relevant Orders   Tdap vaccine greater than or equal to 7yo IM (Completed)       Return in about 2 weeks (around 12/20/2022) for Reassessment, fasting labs.   Haydee Salter, MD

## 2022-12-14 NOTE — Therapy (Signed)
OUTPATIENT PHYSICAL THERAPY THORACOLUMBAR EVALUATION   Patient Name: Maria Mccarty MRN: [id number] DOB:[birth year], 52 y.o., female Today's Date: 12/15/2022  END OF SESSION:  PT End of Session - 12/15/22 0759     Visit Number 1    Date for PT Re-Evaluation 02/02/23    Authorization Type  Medicaid    PT Start Time 0800    PT Stop Time 0845    PT Time Calculation (min) 45 min    Activity Tolerance Patient tolerated treatment well    Behavior During Therapy WFL for tasks assessed/performed             Past Medical History:  Diagnosis Date   Acid reflux    Hypertension    Vaginal Pap smear, abnormal    Past Surgical History:  Procedure Laterality Date   43 HOUR [place] STUDY N/A 04/24/2018   Procedure: 24 HOUR PH STUDY;  Surgeon: Mauri Pole, MD;  Location: [facility];  Service: Endoscopy;  Laterality: N/A;   CERVICAL BIOPSY  W/ LOOP ELECTRODE EXCISION     COLPOSCOPY     ESOPHAGEAL MANOMETRY N/A 04/24/2018   Procedure: ESOPHAGEAL MANOMETRY (EM);  Surgeon: Mauri Pole, MD;  Location: [facility];  Service: Endoscopy;  Laterality: N/A;   LEEP     [place] IMPEDANCE STUDY  04/24/2018   Procedure: [place] IMPEDANCE STUDY;  Surgeon: Mauri Pole, MD;  Location: [facility];  Service: Endoscopy;;   TONSILLECTOMY     Patient Active Problem List   Diagnosis Date Noted   Uterine fibroid 12/06/2022   Left sided sciatica 12/06/2022   Cervical spondylosis 10/05/2022   Hyponatremia 02/17/2022   Pharyngeal dysphagia 01/05/2022   Anxiety, generalized 09/11/2020   Essential hypertension 01/02/2020   Gastroesophageal reflux disease    Laryngopharyngeal reflux (LPR) 05/01/2018   Borderline hyperlipidemia 02/23/2018   Gastroparesis 02/22/2018   Chronic idiopathic constipation 12/21/2017   S/P LEEP 12/21/2016    PCP: Arlester Marker  REFERRING PROVIDER: Arlester Marker  REFERRING DIAG: [id number]- Left sided sciatica  Rationale for Evaluation and Treatment:  Rehabilitation  THERAPY DIAG:  Sciatica, left side  Pain in left leg  Numbness and tingling of left lower extremity  ONSET DATE: November 2023  SUBJECTIVE:                                                                                                                                                                                           SUBJECTIVE STATEMENT: I am doing okay other than having issues with my leg. I was pushing my mother in a wheelchair and I twisted the wrong way. I had a shooting pain  from behind my leg all the way up my leg. I am getting numbness and tingling all the way down my leg.   PERTINENT HISTORY:  No remarkable history   PAIN:  Are you having pain? Yes: NPRS scale: 1/10 Pain location: left lateral thigh and SIJ  Pain description: numbness and tingling constantly Aggravating factors: walking for too long, stairs can be hard sometimes Relieving factors: nothing has helped  PRECAUTIONS: None  WEIGHT BEARING RESTRICTIONS: No  FALLS:  Has patient fallen in last 6 months? 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 No significant lumbar spine disc protrusion, foraminal stenosis or central canal stenosis. 2. No acute osseous injury of the lumbar spine   SCREENING FOR RED FLAGS: Bowel or bladder incontinence: No Spinal tumors: No Cauda equina syndrome: No Compression fracture: No Abdominal aneurysm: No  COGNITION: Overall cognitive status: Within functional limits for tasks assessed     SENSATION: Light touch: sensitive to touch on lateral leg  MUSCLE LENGTH: Hamstrings: mild tightness bilaterally  POSTURE: No Significant  postural limitations  PALPATION: Some pressure at L SIJ   LUMBAR ROM:   AROM eval  Flexion WNL  Extension WNL  Right lateral flexion WNL  Left lateral flexion WNL  Right rotation WNL  Left rotation WNL   (Blank rows = not tested)  LOWER EXTREMITY ROM:   grossly WFL   LOWER EXTREMITY MMT:  grossly 5/5   LUMBAR SPECIAL TESTS:  Straight leg raise test: Negative, Slump test: Negative, and FABER test: Positive  FUNCTIONAL TESTS:  5 times sit to stand: 10.26s  TODAY'S TREATMENT:                                                                                                                              DATE: 12/15/22- EVAL    PATIENT EDUCATION:  Education details: POC Person educated: Patient Education method: Explanation Education comprehension: verbalized understanding  HOME EXERCISE PROGRAM: TBD  ASSESSMENT:  CLINICAL IMPRESSION: Patient is a 53 y.o. female who was seen today for physical therapy evaluation and treatment for left leg N/T. She reports she was wheeling her mother around in a wheelchair when she turned wrong and felt a pain down her leg. She was told she possibly had sciatica but her MRI was clear. She reports she feels pressure in her left SIJ and can feel it when she pushes down into it. Her main concerns are the numbness and tingling down her leg, so we tried traction today to see if that would help alleviate any of it. Patient will benefit from skilled PT to address her left left numbness and tingling to be able  to return to PLOF.   REHAB POTENTIAL: Good  CLINICAL DECISION MAKING: Stable/uncomplicated  EVALUATION COMPLEXITY: Low   GOALS: Goals reviewed with patient? Yes  SHORT TERM GOALS: Target date: 01/05/23  Patient will be independent with initial HEP.  Goal status: INITIAL  2.  Patient will report centralization of radicular symptoms.  Baseline: N/T in left leg down to foot Goal status: INITIAL   LONG TERM GOALS: Target date:  02/02/23  Patient will be independent with advanced/ongoing HEP to improve outcomes and carryover.  Goal status: INITIAL  2.  Patient will report 75% improvement in left leg numbness and tingling Goal status: INITIAL   3.  Patient will tolerate 30 min of (standing/sitting/walking) without an increase in numbness/tingling Baseline: power walk increased sx Goal status: INITIAL  PLAN:  PT FREQUENCY: 1x/week  PT DURATION: 6 weeks  PLANNED INTERVENTIONS: Therapeutic exercises, Therapeutic activity, Neuromuscular re-education, Balance training, Gait training, Patient/Family education, Self Care, Joint mobilization, Stair training, Dry Needling, Electrical stimulation, Cryotherapy, Moist heat, Traction, Ultrasound, Ionotophoresis '4mg'$ /ml Dexamethasone, and Manual therapy.  PLAN FOR NEXT SESSION: further SIJ assessment, see how traction went, stretching LE/hip, establish HEP   Andris Baumann, PT 12/15/2022, 8:54 AM

## 2022-12-15 ENCOUNTER — Ambulatory Visit: Payer: Medicaid Other | Attending: Family Medicine

## 2022-12-15 DIAGNOSIS — R202 Paresthesia of skin: Secondary | ICD-10-CM | POA: Diagnosis not present

## 2022-12-15 DIAGNOSIS — M79605 Pain in left leg: Secondary | ICD-10-CM | POA: Insufficient documentation

## 2022-12-15 DIAGNOSIS — M5432 Sciatica, left side: Secondary | ICD-10-CM | POA: Insufficient documentation

## 2022-12-15 DIAGNOSIS — R200 Anesthesia of skin: Secondary | ICD-10-CM | POA: Diagnosis not present

## 2022-12-21 ENCOUNTER — Ambulatory Visit: Payer: Medicaid Other | Admitting: Family Medicine

## 2022-12-21 ENCOUNTER — Encounter: Payer: Self-pay | Admitting: Family Medicine

## 2022-12-21 VITALS — BP 140/82 | HR 69 | Temp 97.4°F | Ht 63.0 in | Wt 138.6 lb

## 2022-12-21 DIAGNOSIS — I10 Essential (primary) hypertension: Secondary | ICD-10-CM

## 2022-12-21 DIAGNOSIS — E871 Hypo-osmolality and hyponatremia: Secondary | ICD-10-CM | POA: Diagnosis not present

## 2022-12-21 DIAGNOSIS — E785 Hyperlipidemia, unspecified: Secondary | ICD-10-CM

## 2022-12-21 DIAGNOSIS — M5432 Sciatica, left side: Secondary | ICD-10-CM | POA: Diagnosis not present

## 2022-12-21 LAB — BASIC METABOLIC PANEL
BUN: 12 mg/dL (ref 6–23)
CO2: 25 mEq/L (ref 19–32)
Calcium: 9.2 mg/dL (ref 8.4–10.5)
Chloride: 105 mEq/L (ref 96–112)
Creatinine, Ser: 0.63 mg/dL (ref 0.40–1.20)
GFR: 101.35 mL/min (ref >60.00)
Glucose, Bld: 87 mg/dL (ref 70–99)
Potassium: 3.9 mEq/L (ref 3.5–5.1)
Sodium: 140 mEq/L (ref 135–145)

## 2022-12-21 LAB — LIPID PANEL
Cholesterol: 216 mg/dL — ABNORMAL HIGH (ref 0–200)
HDL: 81.9 mg/dL (ref >39.00)
LDL Cholesterol: 125 mg/dL — ABNORMAL HIGH (ref 0–99)
NonHDL: 134.45
Total CHOL/HDL Ratio: 3
Triglycerides: 48 mg/dL (ref 0.0–149.0)
VLDL: 9.6 mg/dL (ref 0.0–40.0)

## 2022-12-21 MED ORDER — PREDNISONE 20 MG PO TABS: ORAL_TABLET | ORAL | 0 refills | Status: AC

## 2022-12-21 MED ORDER — AMLODIPINE BESYLATE 5 MG PO TABS: 5.0000 mg | ORAL_TABLET | Freq: Every day | ORAL | 3 refills | Status: DC

## 2022-12-21 NOTE — Progress Notes (Signed)
[facility] [address] [place] [address] Dept: [phone] Dept Fax: [phone]  Chronic Care Office Visit  Subjective:    Patient ID: Maria Mccarty, female    DOB: September 02, 1971, 52 y.o..   MRN: [id number]  Chief Complaint  Patient presents with   Medical Management of Chronic Issues    2 week f/u.  Fasting today.    History of Present Illness:  Patient is in today for reassessment of chronic medical issues. Ms. Assi recently established care with me.  Ms. Krupicka has a history of hypertension. She is currently managed on Zestoretic 20-12.5 mg q am and lisinopril 10 mg q pm. She has had an issue with hyponatremia. At her last visit, I stopped her Zestoretic and placed her on lisinopril alone, increasing the dose to 40 mg daily. She states her home blood pressure has been 120/80.   Ms. Mendel Ryder has a history of a pain in her left leg. She describes this as mostly a tingling sensation through the entire leg. She had an MRI scan of her lumbar spine in Jan. which was normal. I referred her to PT. She has had one visit so far, but cannot tell that anything has improved. She asks about a course of prednisone for managing her sciatica.  Past Medical History: Patient Active Problem List   Diagnosis Date Noted   Uterine fibroid 12/06/2022   Left sided sciatica 12/06/2022   Cervical spondylosis 10/05/2022   Hyponatremia 02/17/2022   Pharyngeal dysphagia 01/05/2022   Anxiety, generalized 09/11/2020   Essential hypertension 01/02/2020   Gastroesophageal reflux disease    Laryngopharyngeal reflux (LPR) 05/01/2018   Borderline hyperlipidemia 02/23/2018   Gastroparesis 02/22/2018   Chronic idiopathic constipation 12/21/2017   S/P LEEP 12/21/2016   Past Surgical History:  Procedure Laterality Date   24 HOUR [place] STUDY N/A 04/24/2018   Procedure: 24 HOUR PH STUDY;  Surgeon: Mauri Pole, MD;  [facility];   Service: Endoscopy;  Laterality: N/A;   CERVICAL BIOPSY  W/ LOOP ELECTRODE EXCISION     COLPOSCOPY     ESOPHAGEAL MANOMETRY N/A 04/24/2018   Procedure: ESOPHAGEAL MANOMETRY (EM);  Surgeon: Mauri Pole, MD;  [facility]: [facility];  Service: Endoscopy;  Laterality: N/A;   LEEP     [place] IMPEDANCE STUDY  04/24/2018   Procedure: [place] IMPEDANCE STUDY;  Surgeon: Mauri Pole, MD;  Location: [facility];  Service: Endoscopy;;   TONSILLECTOMY     Family History  Problem Relation Age of Onset   Cancer Mother        Colon   Alcohol abuse Mother    Hypertension Mother    Peripheral Artery Disease Mother    Cancer Brother        Kidney   Heart disease Brother    Hypertension Brother    Hypertension Brother    Stroke Paternal Grandmother    Diabetes Paternal Grandfather    Outpatient Medications Prior to Visit  Medication Sig Dispense Refill   Cholecalciferol (VITAMIN D) 125 MCG (5000 UT) CAPS Take 1 capsule by mouth daily.     hydrOXYzine (ATARAX) 10 MG tablet Take 1 tablet (10 mg total) by mouth 3 (three) times daily as needed for anxiety. 90 tablet 1   lisinopril (ZESTRIL) 40 MG tablet Take 1 tablet (40 mg total) by mouth daily. 90 tablet 3   NEXIUM 40 MG capsule Take 1 capsule (40 mg total) by mouth daily as needed. 90 capsule  1   No facility-administered medications prior to visit.   Allergies  Allergen Reactions   Penicillins Rash   Objective:   Today's Vitals   12/21/22 0857 12/21/22 0917  BP: (!) 144/82 (!) 140/82  Pulse: 69   Temp: (!) 97.4 F (36.3 C)   TempSrc: Temporal   SpO2: 98%   Weight: 138 lb 9.6 oz (62.9 kg)   Height: 5\' 3"  (1.6 m)    Body mass index is 24.55 kg/m.   General: Well developed, well nourished. No acute distress. Psych: Alert and oriented. Normal mood and affect.  Health Maintenance Due  Topic Date Due   HIV Screening  Never done   Hepatitis C Screening  Never done   Zoster Vaccines- Shingrix (1 of 2) Never done     Assessment  & Plan:   Problem List Items Addressed This Visit       Cardiovascular and Mediastinum   Essential hypertension - Primary    BP remains high in the office today. I recommend we add amlodipine 5 mg daily to her current therapy of lisinopril 40 mg daily.      Relevant Medications   amLODipine (NORVASC) 5 MG tablet     Nervous and Auditory   Left sided sciatica    Symptoms remain consistent with sciatica. I recommend she continue PT, as a single visit would not be expected to resolve her issue. I will prescribe a tapering dose of prednisone to see if this will help, but she should monitor her BP closely.      Relevant Medications   predniSONE (DELTASONE) 20 MG tablet     Other   Borderline hyperlipidemia    We will check fasting lipids today.      Relevant Medications   amLODipine (NORVASC) 5 MG tablet   Other Relevant Orders   Lipid panel   Hyponatremia    She has been off of HCTZ for 2 weeks. I will reassess her serum sodium to see if this has improved off the diuretic.      Relevant Orders   Basic metabolic panel    Return in about 4 weeks (around 01/18/2023) for Reassessment.   Haydee Salter, MD

## 2022-12-21 NOTE — Assessment & Plan Note (Signed)
BP remains high in the office today. I recommend we add amlodipine 5 mg daily to her current therapy of lisinopril 40 mg daily.

## 2022-12-21 NOTE — Assessment & Plan Note (Signed)
She has been off of HCTZ for 2 weeks. I will reassess her serum sodium to see if this has improved off the diuretic.

## 2022-12-21 NOTE — Assessment & Plan Note (Signed)
Symptoms remain consistent with sciatica. I recommend she continue PT, as a single visit would not be expected to resolve her issue. I will prescribe a tapering dose of prednisone to see if this will help, but she should monitor her BP closely.

## 2022-12-21 NOTE — Assessment & Plan Note (Signed)
We will check fasting lipids today.

## 2022-12-23 NOTE — Therapy (Signed)
OUTPATIENT PHYSICAL THERAPY THORACOLUMBAR TREATMENT   Patient Name: Maria Mccarty MRN: [id number] DOB:05-06-1971, 52 y.o., female Today's Date: 12/24/2022  END OF SESSION:  PT End of Session - 12/24/22 0759     Visit Number 2    Date for PT Re-Evaluation 02/02/23    Authorization Type [place] Medicaid    PT Start Time 0800    PT Stop Time 0845    PT Time Calculation (min) 45 min    Activity Tolerance Patient tolerated treatment well    Behavior During Therapy WFL for tasks assessed/performed              Past Medical History:  Diagnosis Date   Acid reflux    Hypertension    Vaginal Pap smear, abnormal    Past Surgical History:  Procedure Laterality Date   45 HOUR [place] STUDY N/A 04/24/2018   Procedure: 24 HOUR PH STUDY;  Surgeon: Mauri Pole, MD;  [facility];  Service: Endoscopy;  Laterality: N/A;   CERVICAL BIOPSY  W/ LOOP ELECTRODE EXCISION     COLPOSCOPY     ESOPHAGEAL MANOMETRY N/A 04/24/2018   Procedure: ESOPHAGEAL MANOMETRY (EM);  Surgeon: Mauri Pole, MD;  [facility];  Service: Endoscopy;  Laterality: N/A;   LEEP     [place] IMPEDANCE STUDY  04/24/2018   Procedure: [place] IMPEDANCE STUDY;  Surgeon: Mauri Pole, MD;  [facility];  Service: Endoscopy;;   TONSILLECTOMY     Patient Active Problem List   Diagnosis Date Noted   Uterine fibroid 12/06/2022   Left sided sciatica 12/06/2022   Cervical spondylosis 10/05/2022   Hyponatremia 02/17/2022   Pharyngeal dysphagia 01/05/2022   Anxiety, generalized 09/11/2020   Essential hypertension 01/02/2020   Gastroesophageal reflux disease    Laryngopharyngeal reflux (LPR) 05/01/2018   Borderline hyperlipidemia 02/23/2018   Gastroparesis 02/22/2018   Chronic idiopathic constipation 12/21/2017   S/P LEEP 12/21/2016    PCP: Arlester Marker  REFERRING PROVIDER: Arlester Marker  REFERRING DIAG: [id number]- Left sided sciatica  Rationale for Evaluation and Treatment:  Rehabilitation  THERAPY DIAG:  Sciatica, left side  Pain in left leg  Numbness and tingling of left lower extremity  ONSET DATE: November 2023  SUBJECTIVE:                                                                                                                                                                                           SUBJECTIVE STATEMENT: Feeling about the same. My whole foot is numb, the traction did not really help. If I push the outside of my hip it hurts like it is sending  a signal.   PERTINENT HISTORY:  No remarkable history   PAIN:  Are you having pain? Yes: NPRS scale: 1/10 Pain location: left lateral thigh and SIJ  Pain description: numbness and tingling constantly Aggravating factors: walking for too long, stairs can be hard sometimes Relieving factors: nothing has helped  PRECAUTIONS: None  WEIGHT BEARING RESTRICTIONS: No  FALLS:  Has patient fallen in last 6 months? 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 No significant lumbar spine disc protrusion, foraminal stenosis or central canal stenosis. 2. No acute osseous injury of the lumbar spine   SCREENING FOR RED FLAGS: Bowel or bladder incontinence: No Spinal tumors: No Cauda equina syndrome: No Compression fracture: No Abdominal aneurysm: No  COGNITION: Overall cognitive status: Within functional limits for tasks assessed     SENSATION: Light touch: sensitive to touch on lateral leg  MUSCLE LENGTH: Hamstrings: mild tightness bilaterally  POSTURE: No Significant postural limitations  PALPATION: Some pressure at L SIJ   LUMBAR ROM:   AROM eval   Flexion WNL  Extension WNL  Right lateral flexion WNL  Left lateral flexion WNL  Right rotation WNL  Left rotation WNL   (Blank rows = not tested)  LOWER EXTREMITY ROM:   grossly WFL   LOWER EXTREMITY MMT:  grossly 5/5   LUMBAR SPECIAL TESTS:  Straight leg raise test: Negative, Slump test: Negative, and FABER test: Positive  FUNCTIONAL TESTS:  5 times sit to stand: 10.26s  TODAY'S TREATMENT:                                                                                                                              DATE:  12/24/22 NuStep L4 x69mins  Passive stretching HS, IT band, piriformis 30s SKTC 30s Clamshells red 2x10  Bridges 2x10  Supine ball adduction 2x10  SLR 2x10 SL abd 2x10 Supine trunk rotations    12/15/22- EVAL    PATIENT EDUCATION:  Education details: POC Person educated: Patient Education method: Explanation Education comprehension: verbalized understanding  HOME EXERCISE PROGRAM: Access Code: [email or web address] URL: [email or web address] Date: 12/24/2022 Prepared by: Andris Baumann  Exercises - Supine Bridge  - 1 x daily - 7 x weekly - 2 sets - 10 reps - Supine Lower Trunk Rotation  - 1 x daily - 7 x weekly - 2 sets - 10 reps - Clamshell  - 1 x daily - 7 x weekly - 2 sets - 10 reps - Supine Hip Adduction Isometric with Ball  - 1 x daily - 7 x weekly - 2 sets - 10 reps - Sidelying Hip Abduction  - 1 x daily - 7 x weekly -  2 sets - 10 reps - Supine Active Straight Leg Raise  - 1 x daily - 7 x weekly - 2 sets - 10 reps  ASSESSMENT:  CLINICAL IMPRESSION: Patient returns with no changes in symptoms. She continues to have complaints of numbness and tingling in her leg and foot. She reports some burning sensations throughout the week and she can feel it if she rubs on the outside of her thigh. We worked on some gentle stretches and strengthening of the hip. Given HEP.   REHAB POTENTIAL: Good  CLINICAL DECISION MAKING:  Stable/uncomplicated  EVALUATION COMPLEXITY: Low   GOALS: Goals reviewed with patient? Yes  SHORT TERM GOALS: Target date: 01/05/23  Patient will be independent with initial HEP.  Goal status: INITIAL  2.  Patient will report centralization of radicular symptoms.  Baseline: N/T in left leg down to foot Goal status: INITIAL   LONG TERM GOALS: Target date: 02/02/23  Patient will be independent with advanced/ongoing HEP to improve outcomes and carryover.  Goal status: INITIAL  2.  Patient will report 75% improvement in left leg numbness and tingling Goal status: INITIAL   3.  Patient will tolerate 30 min of (standing/sitting/walking) without an increase in numbness/tingling Baseline: power walk increased sx Goal status: INITIAL  PLAN:  PT FREQUENCY: 1x/week  PT DURATION: 6 weeks  PLANNED INTERVENTIONS: Therapeutic exercises, Therapeutic activity, Neuromuscular re-education, Balance training, Gait training, Patient/Family education, Self Care, Joint mobilization, Stair training, Dry Needling, Electrical stimulation, Cryotherapy, Moist heat, Traction, Ultrasound, Ionotophoresis 4mg /ml Dexamethasone, and Manual therapy.  PLAN FOR NEXT SESSION: further SIJ assessment, stretching LE/hip   Andris Baumann, PT 12/24/2022, 8:43 AM

## 2022-12-24 ENCOUNTER — Ambulatory Visit: Payer: Medicaid Other

## 2022-12-24 DIAGNOSIS — R202 Paresthesia of skin: Secondary | ICD-10-CM | POA: Diagnosis not present

## 2022-12-24 DIAGNOSIS — M5432 Sciatica, left side: Secondary | ICD-10-CM

## 2022-12-24 DIAGNOSIS — M79605 Pain in left leg: Secondary | ICD-10-CM

## 2022-12-24 DIAGNOSIS — R200 Anesthesia of skin: Secondary | ICD-10-CM

## 2023-01-03 ENCOUNTER — Ambulatory Visit: Payer: Medicaid Other

## 2023-01-06 NOTE — Therapy (Signed)
OUTPATIENT PHYSICAL THERAPY THORACOLUMBAR TREATMENT   Patient Name: Maria Mccarty MRN: [id number] DOB:1971/07/16, 52 y.o., female Today's Date: 01/10/2023  END OF SESSION:  PT End of Session - 01/10/23 0804     Visit Number 3    Date for PT Re-Evaluation 02/02/23    Authorization Type  Medicaid    PT Start Time 0800    PT Stop Time 0845    PT Time Calculation (min) 45 min    Activity Tolerance Patient tolerated treatment well    Behavior During Therapy WFL for tasks assessed/performed               Past Medical History:  Diagnosis Date   Acid reflux    Hypertension    Vaginal Pap smear, abnormal    Past Surgical History:  Procedure Laterality Date   91 HOUR PH STUDY N/A 04/24/2018   Procedure: 24 HOUR PH STUDY;  Surgeon: Napoleon Form, MD;  Location: [facility];  Service: Endoscopy;  Laterality: N/A;   CERVICAL BIOPSY  W/ LOOP ELECTRODE EXCISION     COLPOSCOPY     ESOPHAGEAL MANOMETRY N/A 04/24/2018   Procedure: ESOPHAGEAL MANOMETRY (EM);  Surgeon: Napoleon Form, MD;  Location: [facility];  Service: Endoscopy;  Laterality: N/A;   LEEP     PH IMPEDANCE STUDY  04/24/2018   Procedure: PH IMPEDANCE STUDY;  Surgeon: Napoleon Form, MD;  Location: [facility];  Service: Endoscopy;;   TONSILLECTOMY     Patient Active Problem List   Diagnosis Date Noted   Uterine fibroid 12/06/2022   Left sided sciatica 12/06/2022   Cervical spondylosis 10/05/2022   Hyponatremia 02/17/2022   Pharyngeal dysphagia 01/05/2022   Anxiety, generalized 09/11/2020   Essential hypertension 01/02/2020   Gastroesophageal reflux disease    Laryngopharyngeal reflux (LPR) 05/01/2018   Borderline hyperlipidemia 02/23/2018   Gastroparesis 02/22/2018   Chronic idiopathic constipation 12/21/2017   S/P LEEP 12/21/2016    PCP: Herbie Drape  REFERRING PROVIDER: Herbie Drape  REFERRING DIAG: B44.96- Left sided sciatica  Rationale for Evaluation and Treatment:  Rehabilitation  THERAPY DIAG:  Sciatica, left side  Pain in left leg  Numbness and tingling of left lower extremity  ONSET DATE: November 2023  SUBJECTIVE:                                                                                                                                                                                           SUBJECTIVE STATEMENT: The tingling has subsided a great deal, sometimes I still a little bit of pain in the hip. It is like I can rub it and feel it.  PERTINENT HISTORY:  No remarkable history   PAIN:  Are you having pain? Yes: NPRS scale: 1/10 Pain location: left lateral thigh and SIJ  Pain description: numbness and tingling constantly Aggravating factors: walking for too long, stairs can be hard sometimes Relieving factors: nothing has helped  PRECAUTIONS: None  WEIGHT BEARING RESTRICTIONS: No  FALLS:  Has patient fallen in last 6 months? 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 No significant lumbar spine disc protrusion, foraminal stenosis or central canal stenosis. 2. No acute osseous injury of the lumbar spine   SCREENING FOR RED FLAGS: Bowel or bladder incontinence: No Spinal tumors: No Cauda equina syndrome: No Compression fracture: No Abdominal aneurysm: No  COGNITION: Overall cognitive status: Within functional limits for tasks assessed     SENSATION: Light touch: sensitive to touch on lateral leg  MUSCLE LENGTH: Hamstrings: mild tightness bilaterally  POSTURE: No Significant postural limitations  PALPATION: Some pressure at L SIJ   LUMBAR ROM:   AROM eval  Flexion WNL  Extension WNL   Right lateral flexion WNL  Left lateral flexion WNL  Right rotation WNL  Left rotation WNL   (Blank rows = not tested)  LOWER EXTREMITY ROM:   grossly WFL   LOWER EXTREMITY MMT:  grossly 5/5   LUMBAR SPECIAL TESTS:  Straight leg raise test: Negative, Slump test: Negative, and FABER test: Positive  FUNCTIONAL TESTS:  5 times sit to stand: 10.26s  TODAY'S TREATMENT:                                                                                                                              DATE:  01/10/23 NuStep L5 x26mins Seated HS and piriformis stretch 30s each side  Lateral band walks green  Step ups 6"  Calf stretch 30s Calf raises 2x10 STS on airex 2x10 2 way hip 2# 2x10  12/24/22 NuStep L4 x72mins  Passive stretching HS, IT band, piriformis 30s SKTC 30s Clamshells red 2x10  Bridges 2x10  Supine ball adduction 2x10  SLR 2x10 SL abd 2x10 Supine trunk rotations    12/15/22- EVAL    PATIENT EDUCATION:  Education details: POC Person educated: Patient Education method: Explanation Education comprehension: verbalized understanding  HOME EXERCISE PROGRAM: Access Code: [email or web address] URL: [email or web address] Date: 12/24/2022 Prepared by: Cassie Freer  Exercises - Supine Bridge  - 1 x daily - 7 x weekly - 2 sets - 10 reps - Supine Lower Trunk Rotation  - 1 x daily - 7 x weekly - 2 sets - 10 reps - Clamshell  - 1 x daily - 7 x weekly - 2 sets - 10 reps - Supine  Hip Adduction Isometric with Ball  - 1 x daily - 7 x weekly - 2 sets - 10 reps - Sidelying Hip Abduction  - 1 x daily - 7 x weekly - 2 sets - 10 reps - Supine Active Straight Leg Raise  - 1 x daily - 7 x weekly - 2 sets - 10 reps  ASSESSMENT:  CLINICAL IMPRESSION: Patient returns with some improvement in L hip pain and numbness. We worked on some gentle stretches and strengthening of the hip. She does well, has some numbness with strengthening. Seems to have more relief of symptoms with  stretching.   REHAB POTENTIAL: Good  CLINICAL DECISION MAKING: Stable/uncomplicated  EVALUATION COMPLEXITY: Low   GOALS: Goals reviewed with patient? Yes  SHORT TERM GOALS: Target date: 01/05/23  Patient will be independent with initial HEP.  Goal status: MET  2.  Patient will report centralization of radicular symptoms.  Baseline: N/T in left leg down to foot Goal status: IN PROGRESS   LONG TERM GOALS: Target date: 02/02/23  Patient will be independent with advanced/ongoing HEP to improve outcomes and carryover.  Goal status: INITIAL  2.  Patient will report 75% improvement in left leg numbness and tingling Goal status: INITIAL   3.  Patient will tolerate 30 min of (standing/sitting/walking) without an increase in numbness/tingling Baseline: power walk increased sx Goal status: INITIAL  PLAN:  PT FREQUENCY: 1x/week  PT DURATION: 6 weeks  PLANNED INTERVENTIONS: Therapeutic exercises, Therapeutic activity, Neuromuscular re-education, Balance training, Gait training, Patient/Family education, Self Care, Joint mobilization, Stair training, Dry Needling, Electrical stimulation, Cryotherapy, Moist heat, Traction, Ultrasound, Ionotophoresis 4mg /ml Dexamethasone, and Manual therapy.  PLAN FOR NEXT SESSION: strengthening and stretching LE/hip   Cassie Freer, PT 01/10/2023, 8:43 AM

## 2023-01-10 ENCOUNTER — Ambulatory Visit: Payer: Medicaid Other | Attending: Family Medicine

## 2023-01-10 DIAGNOSIS — R202 Paresthesia of skin: Secondary | ICD-10-CM | POA: Insufficient documentation

## 2023-01-10 DIAGNOSIS — R200 Anesthesia of skin: Secondary | ICD-10-CM

## 2023-01-10 DIAGNOSIS — M5432 Sciatica, left side: Secondary | ICD-10-CM | POA: Diagnosis not present

## 2023-01-10 DIAGNOSIS — M79605 Pain in left leg: Secondary | ICD-10-CM | POA: Diagnosis not present

## 2023-01-14 NOTE — Therapy (Signed)
OUTPATIENT PHYSICAL THERAPY THORACOLUMBAR TREATMENT   Patient Name: Maria Mccarty MRN: [id number] DOB:1971/07/16, 52 y.o., female Today's Date: 01/10/2023  END OF SESSION:  PT End of Session - 01/10/23 0804     Visit Number 3    Date for PT Re-Evaluation 02/02/23    Authorization Type  Medicaid    PT Start Time 0800    PT Stop Time 0845    PT Time Calculation (min) 45 min    Activity Tolerance Patient tolerated treatment well    Behavior During Therapy WFL for tasks assessed/performed               Past Medical History:  Diagnosis Date   Acid reflux    Hypertension    Vaginal Pap smear, abnormal    Past Surgical History:  Procedure Laterality Date   91 HOUR PH STUDY N/A 04/24/2018   Procedure: 24 HOUR PH STUDY;  Surgeon: Napoleon Form, MD;  Location: [facility];  Service: Endoscopy;  Laterality: N/A;   CERVICAL BIOPSY  W/ LOOP ELECTRODE EXCISION     COLPOSCOPY     ESOPHAGEAL MANOMETRY N/A 04/24/2018   Procedure: ESOPHAGEAL MANOMETRY (EM);  Surgeon: Napoleon Form, MD;  Location: [facility];  Service: Endoscopy;  Laterality: N/A;   LEEP     PH IMPEDANCE STUDY  04/24/2018   Procedure: PH IMPEDANCE STUDY;  Surgeon: Napoleon Form, MD;  Location: [facility];  Service: Endoscopy;;   TONSILLECTOMY     Patient Active Problem List   Diagnosis Date Noted   Uterine fibroid 12/06/2022   Left sided sciatica 12/06/2022   Cervical spondylosis 10/05/2022   Hyponatremia 02/17/2022   Pharyngeal dysphagia 01/05/2022   Anxiety, generalized 09/11/2020   Essential hypertension 01/02/2020   Gastroesophageal reflux disease    Laryngopharyngeal reflux (LPR) 05/01/2018   Borderline hyperlipidemia 02/23/2018   Gastroparesis 02/22/2018   Chronic idiopathic constipation 12/21/2017   S/P LEEP 12/21/2016    PCP: Herbie Drape  REFERRING PROVIDER: Herbie Drape  REFERRING DIAG: B44.96- Left sided sciatica  Rationale for Evaluation and Treatment:  Rehabilitation  THERAPY DIAG:  Sciatica, left side  Pain in left leg  Numbness and tingling of left lower extremity  ONSET DATE: November 2023  SUBJECTIVE:                                                                                                                                                                                           SUBJECTIVE STATEMENT: The tingling has subsided a great deal, sometimes I still a little bit of pain in the hip. It is like I can rub it and feel it.  PERTINENT HISTORY:  No remarkable history   PAIN:  Are you having pain? Yes: NPRS scale: 1/10 Pain location: left lateral thigh and SIJ  Pain description: numbness and tingling constantly Aggravating factors: walking for too long, stairs can be hard sometimes Relieving factors: nothing has helped  PRECAUTIONS: None  WEIGHT BEARING RESTRICTIONS: No  FALLS:  Has patient fallen in last 6 months? 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 No significant lumbar spine disc protrusion, foraminal stenosis or central canal stenosis. 2. No acute osseous injury of the lumbar spine   SCREENING FOR RED FLAGS: Bowel or bladder incontinence: No Spinal tumors: No Cauda equina syndrome: No Compression fracture: No Abdominal aneurysm: No  COGNITION: Overall cognitive status: Within functional limits for tasks assessed     SENSATION: Light touch: sensitive to touch on lateral leg  MUSCLE LENGTH: Hamstrings: mild tightness bilaterally  POSTURE: No Significant postural limitations  PALPATION: Some pressure at L SIJ   LUMBAR ROM:   AROM eval  Flexion WNL  Extension WNL   Right lateral flexion WNL  Left lateral flexion WNL  Right rotation WNL  Left rotation WNL   (Blank rows = not tested)  LOWER EXTREMITY ROM:   grossly WFL   LOWER EXTREMITY MMT:  grossly 5/5   LUMBAR SPECIAL TESTS:  Straight leg raise test: Negative, Slump test: Negative, and FABER test: Positive  FUNCTIONAL TESTS:  5 times sit to stand: 10.26s  TODAY'S TREATMENT:                                                                                                                              DATE:  01/17/23 NuStep Resisted gait Leg ext HS curls Leg press Walking on beam Lateral step ups Stretching   01/10/23 NuStep L5 x18mins Seated HS and piriformis stretch 30s each side  Lateral band walks green  Step ups 6"  Calf stretch 30s Calf raises 2x10 STS on airex 2x10 2 way hip 2# 2x10  12/24/22 NuStep L4 x29mins  Passive stretching HS, IT band, piriformis 30s SKTC 30s Clamshells red 2x10  Bridges 2x10  Supine ball adduction 2x10  SLR 2x10 SL abd 2x10 Supine trunk rotations    12/15/22- EVAL    PATIENT EDUCATION:  Education details: POC Person educated: Patient Education method: Explanation Education comprehension: verbalized understanding  HOME EXERCISE PROGRAM: Access Code: [email or web address] URL: [email or web address] Date: 12/24/2022 Prepared by: Cassie Freer  Exercises - Supine Bridge  - 1 x daily - 7 x weekly - 2 sets - 10 reps - Supine Lower Trunk Rotation  - 1 x daily - 7 x weekly - 2 sets - 10 reps -  Clamshell  - 1 x daily - 7 x weekly - 2 sets - 10 reps - Supine Hip Adduction Isometric with Ball  - 1 x daily - 7 x weekly - 2 sets - 10 reps - Sidelying Hip Abduction  - 1 x daily - 7 x weekly - 2 sets - 10 reps - Supine Active Straight Leg Raise  - 1 x daily - 7 x weekly - 2 sets - 10 reps  ASSESSMENT:  CLINICAL IMPRESSION: Patient returns with some improvement in L hip pain and numbness. We worked on some gentle stretches and strengthening of  the hip. She does well, has some numbness with strengthening. Seems to have more relief of symptoms with stretching.   REHAB POTENTIAL: Good  CLINICAL DECISION MAKING: Stable/uncomplicated  EVALUATION COMPLEXITY: Low   GOALS: Goals reviewed with patient? Yes  SHORT TERM GOALS: Target date: 01/05/23  Patient will be independent with initial HEP.  Goal status: MET  2.  Patient will report centralization of radicular symptoms.  Baseline: N/T in left leg down to foot Goal status: IN PROGRESS   LONG TERM GOALS: Target date: 02/02/23  Patient will be independent with advanced/ongoing HEP to improve outcomes and carryover.  Goal status: INITIAL  2.  Patient will report 75% improvement in left leg numbness and tingling Goal status: INITIAL   3.  Patient will tolerate 30 min of (standing/sitting/walking) without an increase in numbness/tingling Baseline: power walk increased sx Goal status: INITIAL  PLAN:  PT FREQUENCY: 1x/week  PT DURATION: 6 weeks  PLANNED INTERVENTIONS: Therapeutic exercises, Therapeutic activity, Neuromuscular re-education, Balance training, Gait training, Patient/Family education, Self Care, Joint mobilization, Stair training, Dry Needling, Electrical stimulation, Cryotherapy, Moist heat, Traction, Ultrasound, Ionotophoresis /ml Dexamethasone, and Manual therapy.  PLAN FOR NEXT SESSION: strengthening and stretching LE/hip   Cassie Freer, PT 01/10/2023, 8:43 AM

## 2023-01-17 ENCOUNTER — Ambulatory Visit: Payer: Medicaid Other

## 2023-01-17 DIAGNOSIS — R200 Anesthesia of skin: Secondary | ICD-10-CM

## 2023-01-17 DIAGNOSIS — M5432 Sciatica, left side: Secondary | ICD-10-CM | POA: Diagnosis not present

## 2023-01-17 DIAGNOSIS — M79605 Pain in left leg: Secondary | ICD-10-CM | POA: Diagnosis not present

## 2023-01-17 DIAGNOSIS — R202 Paresthesia of skin: Secondary | ICD-10-CM | POA: Diagnosis not present

## 2023-01-21 NOTE — Therapy (Signed)
OUTPATIENT PHYSICAL THERAPY THORACOLUMBAR TREATMENT   Patient Name: Maria Mccarty MRN: [id number] DOB:1971-06-29, 52 y.o., female Today's Date: 01/24/2023  END OF SESSION:  PT End of Session - 01/24/23 0802     Visit Number 5    Date for PT Re-Evaluation 02/02/23    Authorization Type [place] Medicaid    PT Start Time 0800    PT Stop Time 0845    PT Time Calculation (min) 45 min    Activity Tolerance Patient tolerated treatment well    Behavior During Therapy WFL for tasks assessed/performed               Past Medical History:  Diagnosis Date   Acid reflux    Hypertension    Vaginal Pap smear, abnormal    Past Surgical History:  Procedure Laterality Date   63 HOUR PH STUDY N/A 04/24/2018   Procedure: 24 HOUR PH STUDY;  Surgeon: Napoleon Form, MD;  Location: [facility];  Service: Endoscopy;  Laterality: N/A;   CERVICAL BIOPSY  W/ LOOP ELECTRODE EXCISION     COLPOSCOPY     ESOPHAGEAL MANOMETRY N/A 04/24/2018   Procedure: ESOPHAGEAL MANOMETRY (EM);  Surgeon: Napoleon Form, MD;  Location: [facility];  Service: Endoscopy;  Laterality: N/A;   LEEP     PH IMPEDANCE STUDY  04/24/2018   Procedure: PH IMPEDANCE STUDY;  Surgeon: Napoleon Form, MD;  Location: [facility];  Service: Endoscopy;;   TONSILLECTOMY     Patient Active Problem List   Diagnosis Date Noted   Uterine fibroid 12/06/2022   Left sided sciatica 12/06/2022   Cervical spondylosis 10/05/2022   Hyponatremia 02/17/2022   Pharyngeal dysphagia 01/05/2022   Anxiety, generalized 09/11/2020   Essential hypertension 01/02/2020   Gastroesophageal reflux disease    Laryngopharyngeal reflux (LPR) 05/01/2018   Borderline hyperlipidemia 02/23/2018   Gastroparesis 02/22/2018   Chronic idiopathic constipation 12/21/2017   S/P LEEP 12/21/2016    PCP: Herbie Drape  REFERRING PROVIDER: Herbie Drape  REFERRING DIAG: W09.81- Left sided sciatica  Rationale for Evaluation and Treatment:  Rehabilitation  THERAPY DIAG:  Pain in left leg  Numbness and tingling of left lower extremity  Sciatica, left side  ONSET DATE: November 2023  SUBJECTIVE:                                                                                                                                                                                           SUBJECTIVE STATEMENT: Last time since we did the exercises, my leg has been bothering me.   PERTINENT HISTORY:  No remarkable history   PAIN:  Are you having  pain? Yes: NPRS scale: 1/10 Pain location: left lateral thigh and SIJ  Pain description: numbness and tingling constantly Aggravating factors: walking for too long, stairs can be hard sometimes Relieving factors: nothing has helped  PRECAUTIONS: None  WEIGHT BEARING RESTRICTIONS: No  FALLS:  Has patient fallen in last 6 months? 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 No significant lumbar spine disc protrusion, foraminal stenosis or central canal stenosis. 2. No acute osseous injury of the lumbar spine   SCREENING FOR RED FLAGS: Bowel or bladder incontinence: No Spinal tumors: No Cauda equina syndrome: No Compression fracture: No Abdominal aneurysm: No  COGNITION: Overall cognitive status: Within functional limits for tasks assessed     SENSATION: Light touch: sensitive to touch on lateral leg  MUSCLE LENGTH: Hamstrings: mild tightness bilaterally  POSTURE: No Significant postural limitations  PALPATION: Some pressure at L SIJ   LUMBAR ROM:   AROM eval  Flexion WNL  Extension WNL  Right lateral flexion WNL  Left lateral flexion WNL   Right rotation WNL  Left rotation WNL   (Blank rows = not tested)  LOWER EXTREMITY ROM:   grossly WFL   LOWER EXTREMITY MMT:  grossly 5/5   LUMBAR SPECIAL TESTS:  Straight leg raise test: Negative, Slump test: Negative, and FABER test: Positive  FUNCTIONAL TESTS:  5 times sit to stand: 10.26s  TODAY'S TREATMENT:                                                                                                                              DATE:  01/24/23 NuStep L5x28mins Step ups from airex 6" Lateral step ups 6" STS with resistance at knees 2x10 Bridges with green band 2x10 Sidelying clamshells 2x10  Passive Stretching- HS, piriformis, IT band, glutes, trunk rotations, hip flexor    01/17/23 Bike L4 x82mins  Resisted gait 20# 4 way x4 Leg ext 10# 2x10  HS curls 20# 2x10  Leg press 20# 2x10  Calf stretch 30s Calf raises 2x10 Walking on beam Lateral step ups 6"   01/10/23 NuStep L5 x69mins Seated HS and piriformis stretch 30s each side  Lateral band walks green  Step ups 6"  Calf stretch 30s Calf raises 2x10 STS on airex 2x10 2 way hip 2# 2x10  12/24/22 NuStep L4 x83mins  Passive stretching HS, IT band, piriformis 30s SKTC 30s Clamshells red 2x10  Bridges 2x10  Supine ball adduction 2x10  SLR 2x10 SL abd 2x10 Supine trunk rotations    12/15/22- EVAL    PATIENT EDUCATION:  Education details: POC Person educated: Patient Education method: Explanation Education comprehension: verbalized understanding  HOME EXERCISE PROGRAM: Access  Code: [email or web address] URL: [email or web address] Date: 12/24/2022 Prepared by: Cassie Freer  Exercises - Supine Bridge  - 1 x daily - 7 x weekly - 2 sets - 10 reps - Supine Lower Trunk Rotation  - 1 x daily - 7 x weekly - 2 sets - 10 reps - Clamshell  - 1 x daily - 7 x weekly - 2 sets - 10 reps - Supine Hip Adduction Isometric with Ball  - 1 x daily - 7 x weekly - 2 sets - 10 reps - Sidelying Hip Abduction  - 1 x daily  - 7 x weekly - 2 sets - 10 reps - Supine Active Straight Leg Raise  - 1 x daily - 7 x weekly - 2 sets - 10 reps  ASSESSMENT:  CLINICAL IMPRESSION: Patient returns with increased numbness and tingling in her leg following last visit. Backed off with heavy strengthening today and focused more on stretching. She may have piriformis syndrome.  REHAB POTENTIAL: Good  CLINICAL DECISION MAKING: Stable/uncomplicated  EVALUATION COMPLEXITY: Low   GOALS: Goals reviewed with patient? Yes  SHORT TERM GOALS: Target date: 01/05/23  Patient will be independent with initial HEP.  Goal status: MET  2.  Patient will report centralization of radicular symptoms.  Baseline: N/T in left leg down to foot Goal status: IN PROGRESS   LONG TERM GOALS: Target date: 02/02/23  Patient will be independent with advanced/ongoing HEP to improve outcomes and carryover.  Goal status: INITIAL  2.  Patient will report 75% improvement in left leg numbness and tingling Goal status: INITIAL   3.  Patient will tolerate 30 min of (standing/sitting/walking) without an increase in numbness/tingling Baseline: power walk increased sx Goal status: INITIAL  PLAN:  PT FREQUENCY: 1x/week  PT DURATION: 6 weeks  PLANNED INTERVENTIONS: Therapeutic exercises, Therapeutic activity, Neuromuscular re-education, Balance training, Gait training, Patient/Family education, Self Care, Joint mobilization, Stair training, Dry Needling, Electrical stimulation, Cryotherapy, Moist heat, Traction, Ultrasound, Ionotophoresis /ml Dexamethasone, and Manual therapy.  PLAN FOR NEXT SESSION: strengthening and stretching LE/hip   Cassie Freer, PT 01/24/2023, 8:43 AM

## 2023-01-24 ENCOUNTER — Ambulatory Visit: Payer: Medicaid Other

## 2023-01-24 DIAGNOSIS — R200 Anesthesia of skin: Secondary | ICD-10-CM | POA: Diagnosis not present

## 2023-01-24 DIAGNOSIS — M79605 Pain in left leg: Secondary | ICD-10-CM

## 2023-01-24 DIAGNOSIS — M5432 Sciatica, left side: Secondary | ICD-10-CM | POA: Diagnosis not present

## 2023-01-24 DIAGNOSIS — R202 Paresthesia of skin: Secondary | ICD-10-CM | POA: Diagnosis not present

## 2023-01-27 ENCOUNTER — Ambulatory Visit: Payer: Medicaid Other | Admitting: Family Medicine

## 2023-01-27 VITALS — BP 130/76 | HR 72 | Temp 97.4°F | Ht 63.0 in | Wt 141.2 lb

## 2023-01-27 DIAGNOSIS — K5904 Chronic idiopathic constipation: Secondary | ICD-10-CM | POA: Diagnosis not present

## 2023-01-27 DIAGNOSIS — I10 Essential (primary) hypertension: Secondary | ICD-10-CM

## 2023-01-27 DIAGNOSIS — E871 Hypo-osmolality and hyponatremia: Secondary | ICD-10-CM | POA: Diagnosis not present

## 2023-01-27 DIAGNOSIS — R350 Frequency of micturition: Secondary | ICD-10-CM

## 2023-01-27 LAB — POCT URINALYSIS DIPSTICK
Bilirubin, UA: NEGATIVE
Glucose, UA: NEGATIVE
Ketones, UA: NEGATIVE
Leukocytes, UA: NEGATIVE
Nitrite, UA: NEGATIVE
Protein, UA: NEGATIVE
Spec Grav, UA: 1.015 (ref 1.010–1.025)
Urobilinogen, UA: 0.2 E.U./dL
pH, UA: 6 (ref 5.0–8.0)

## 2023-01-27 NOTE — Assessment & Plan Note (Signed)
Resolved off of diuretics.

## 2023-01-27 NOTE — Assessment & Plan Note (Signed)
Discussed importance of fiber in helping to maintain normal bowel movements. I do not feel a probiotic will necessarily help this. I emphasized fiber and hydration as a better approach to her constipation.

## 2023-01-27 NOTE — Progress Notes (Signed)
[facility] PRIMARY CARE [facility] Trecia Rogers [facility] [address] [place] [address] Dept: [phone] Dept Fax: [phone]  Chronic Care Office Visit  Subjective:    Patient ID: Maria Mccarty, female    DOB: 1971-07-04, 52 y.o..   MRN: [id number]  Chief Complaint  Patient presents with   Hypertension    Pt reports stopped amlodipine 2 weeks ago due to hand and foot swelling also noted constipation that she is still struggling with    Urinary Frequency    Pt notes since stating meds a few weeks ago has been going more didn't know if she had a UTI    History of Present Illness:  Patient is in today for reassessment of chronic medical issues.  Ms. Bergman has a history of hypertension. She had been managed on Zestoretic 20-12.5 mg q am and lisinopril 10 mg q pm. She has had an issue with hyponatremia for some years, correlating with when the diuretic was added to her therapy. I stopped her Zestoretic and placed her on lisinopril alone, increasing the dose to 40 mg daily. At her last visit, her BP in office was still high. I added amlodipine 5 mg to her regimen. She notes the amlodipine made her feel like her ankles were swelling. She also noted a worsening of her constipation. She stopped the amlodipine 2 weeks ago. She states her home Bps are running consistently > 140/90.  Ms. Croghan notes she is having urinary frequency for the past several weeks, like water is running straight through her. She relates she has had some intermittent issues with BV int he past and feels this started to occur more frequently after her menopause.  Ms. Corsi has a history of chronic constipation. She uses Colace and Miralax regularly. She recently obtained some Metamucil gummies, but is unsure if this will work.  Past Medical History: Patient Active Problem List   Diagnosis Date Noted   Uterine fibroid 12/06/2022   Left sided sciatica 12/06/2022   Cervical spondylosis  10/05/2022   Hyponatremia 02/17/2022   Pharyngeal dysphagia 01/05/2022   Anxiety, generalized 09/11/2020   Essential hypertension 01/02/2020   Gastroesophageal reflux disease    Laryngopharyngeal reflux (LPR) 05/01/2018   Borderline hyperlipidemia 02/23/2018   Gastroparesis 02/22/2018   Chronic idiopathic constipation 12/21/2017   S/P LEEP 12/21/2016   Past Surgical History:  Procedure Laterality Date   24 HOUR PH STUDY N/A 04/24/2018   Procedure: 24 HOUR PH STUDY;  Surgeon: Napoleon Form, MD;  Location: [facility];  Service: Endoscopy;  Laterality: N/A;   CERVICAL BIOPSY  W/ LOOP ELECTRODE EXCISION     COLPOSCOPY     ESOPHAGEAL MANOMETRY N/A 04/24/2018   Procedure: ESOPHAGEAL MANOMETRY (EM);  Surgeon: Napoleon Form, MD;  Location: [facility];  Service: Endoscopy;  Laterality: N/A;   LEEP     PH IMPEDANCE STUDY  04/24/2018   Procedure: PH IMPEDANCE STUDY;  Surgeon: Napoleon Form, MD;  Location: [facility];  Service: Endoscopy;;   TONSILLECTOMY     Family History  Problem Relation Age of Onset   Cancer Mother        Colon   Alcohol abuse Mother    Hypertension Mother    Peripheral Artery Disease Mother    Cancer Brother        Kidney   Heart disease Brother    Hypertension Brother    Hypertension Brother    Stroke Paternal Grandmother    Diabetes Paternal Grandfather  Outpatient Medications Prior to Visit  Medication Sig Dispense Refill   Cholecalciferol (VITAMIN D) 125 MCG (5000 UT) CAPS Take 1 capsule by mouth daily.     hydrOXYzine (ATARAX) 10 MG tablet Take 1 tablet (10 mg total) by mouth 3 (three) times daily as needed for anxiety. 90 tablet 1   lisinopril (ZESTRIL) 40 MG tablet Take 1 tablet (40 mg total) by mouth daily. 90 tablet 3   NEXIUM 40 MG capsule Take 1 capsule (40 mg total) by mouth daily as needed. 90 capsule 1   amLODipine (NORVASC) 5 MG tablet Take 1 tablet (5 mg total) by mouth daily. (Patient not taking: Reported on 01/27/2023)  30 tablet 3   No facility-administered medications prior to visit.   Allergies  Allergen Reactions   Penicillins Rash   Objective:   Today's Vitals   01/27/23 0828  BP: 130/76  Pulse: 72  Temp: (!) 97.4 F (36.3 C)  TempSrc: Temporal  SpO2: 100%  Weight: 141 lb 3.2 oz (64 kg)  Height:  (1.6 m)   Body mass index is 25.01 kg/m.   General: Well developed, well nourished. No acute distress. Psych: Alert and oriented. Normal mood and affect.  Health Maintenance Due  Topic Date Due   HIV Screening  Never done   Hepatitis C Screening  Never done   Zoster Vaccines- Shingrix (1 of 2) Never done   COVID-19 Vaccine (5 - 2023-24 season) 06/04/2022   Lab Results    Latest Ref Rng & Units 12/21/2022   10:00 AM 11/02/2022    2:39 PM 10/27/2022    5:35 PM  CMP  Glucose 70 - 99 mg/dL 87  97  098   BUN 6 - 23 mg/dL Creatinine 0.40 - 1.20 mg/dL 1.19  1.47  8.29   Sodium 135 - 145 mEq/L 140  131  127   Potassium 3.5 - 5.1 mEq/L 3.9  3.8  3.1   Chloride 96 - 112 mEq/L 105  90  88   CO2 19 - 32 mEq/L 25  32  28   Calcium 8.4 - 10.5 mg/dL 9.2  9.7  8.6   Total Protein 6.1 - 8.1 g/dL  7.6  7.6   Total Bilirubin 0.2 - 1.2 mg/dL  0.5  0.7   Alkaline Phos 38 - 126 U/L   54   AST 10 - 35 U/L  16  28   ALT 6 - 29 U/L  17  23    Lab Results  Component Value Date   CHOL 216 (H) 12/21/2022   HDL 81.90 12/21/2022   LDLCALC 125 (H) 12/21/2022   TRIG 48.0 12/21/2022   CHOLHDL 3 12/21/2022   Urine dipstick shows negative for nitrites, leukocytes, glucose, protein, ketones, urobilinogen, positive for red blood cells  Assessment & Plan:   Problem List Items Addressed This Visit       Cardiovascular and Mediastinum   Essential hypertension    BP is in an acceptable range today. We will leave off the amlodipine, continuing lisinopril 40 mg daily. I will have her keep a log of home BPs and see her back in 1 month to reassess.        Digestive   Chronic idiopathic  constipation    Discussed importance of fiber in helping to maintain normal bowel movements. I do not feel a probiotic will necessarily help this. I emphasized fiber and hydration as a better approach to her  constipation.        Other   Hyponatremia    Resolved off of diuretics.      Other Visit Diagnoses     Frequent urination    -  Primary   UA is equivocal. I will send a urine culture.We did discuss the possibility of GUSM. She will talk to her GYN about this.   Relevant Orders   POCT Urinalysis Dipstick (Completed)   Urine Culture       Return in about 4 weeks (around 02/24/2023) for Reassessment.   Loyola Mast, MD

## 2023-01-27 NOTE — Patient Instructions (Signed)
Constipation: Our goal is to achieve formed bowel movements daily or every-other-day.  You may need to try different combinations of the following options to find what works best for you - everybody's body works differently so feel free to adjust the dosages as needed.  Some options to help maintain bowel health include:   Dietary changes (more leafy greens, vegetables and fruits; less processed foods) Fiber supplementation (Benefiber, FiberCon, Metamucil or Psyllium). Start slow and increase gradually to full dose. Over-the-counter agents such as: stool softeners (Docusate or Colace) and/or laxatives (Miralax, milk of magnesia)  "Power Pudding" is a natural mixture that may help your constipation.  To make blend 1 cup applesauce, 1 cup wheat bran, and 3/4 cup prune juice, refrigerate and then take 1 tablespoon daily with a large glass of water as needed.  

## 2023-01-27 NOTE — Assessment & Plan Note (Signed)
BP is in an acceptable range today. We will leave off the amlodipine, continuing lisinopril 40 mg daily. I will have her keep a log of home BPs and see her back in 1 month to reassess.

## 2023-02-01 ENCOUNTER — Ambulatory Visit: Payer: Medicaid Other

## 2023-02-01 DIAGNOSIS — M5432 Sciatica, left side: Secondary | ICD-10-CM

## 2023-02-01 DIAGNOSIS — R202 Paresthesia of skin: Secondary | ICD-10-CM | POA: Diagnosis not present

## 2023-02-01 DIAGNOSIS — R200 Anesthesia of skin: Secondary | ICD-10-CM | POA: Diagnosis not present

## 2023-02-01 DIAGNOSIS — M79605 Pain in left leg: Secondary | ICD-10-CM

## 2023-02-01 NOTE — Therapy (Signed)
OUTPATIENT PHYSICAL THERAPY THORACOLUMBAR TREATMENT   Patient Name: Maria Mccarty MRN: [id number] DOB:01/26/1971, 52 y.o., female Today's Date: 02/01/2023  END OF SESSION:  PT End of Session - 02/01/23 0931     Visit Number 6    Date for PT Re-Evaluation 02/02/23    Authorization Type [place] Medicaid    PT Start Time 0930    PT Stop Time 1015    PT Time Calculation (min) 45 min    Activity Tolerance Patient tolerated treatment well    Behavior During Therapy WFL for tasks assessed/performed                Past Medical History:  Diagnosis Date   Acid reflux    Hypertension    Vaginal Pap smear, abnormal    Past Surgical History:  Procedure Laterality Date   81 HOUR PH STUDY N/A 04/24/2018   Procedure: 24 HOUR PH STUDY;  Surgeon: Napoleon Form, MD;  Location: [facility];  Service: Endoscopy;  Laterality: N/A;   CERVICAL BIOPSY  W/ LOOP ELECTRODE EXCISION     COLPOSCOPY     ESOPHAGEAL MANOMETRY N/A 04/24/2018   Procedure: ESOPHAGEAL MANOMETRY (EM);  Surgeon: Napoleon Form, MD;  Location: [facility];  Service: Endoscopy;  Laterality: N/A;   LEEP     PH IMPEDANCE STUDY  04/24/2018   Procedure: PH IMPEDANCE STUDY;  Surgeon: Napoleon Form, MD;  Location: [facility];  Service: Endoscopy;;   TONSILLECTOMY     Patient Active Problem List   Diagnosis Date Noted   Uterine fibroid 12/06/2022   Left sided sciatica 12/06/2022   Cervical spondylosis 10/05/2022   Hyponatremia 02/17/2022   Pharyngeal dysphagia 01/05/2022   Anxiety, generalized 09/11/2020   Essential hypertension 01/02/2020   Gastroesophageal reflux disease    Laryngopharyngeal reflux (LPR) 05/01/2018   Borderline hyperlipidemia 02/23/2018   Gastroparesis 02/22/2018   Chronic idiopathic constipation 12/21/2017   S/P LEEP 12/21/2016    PCP: Herbie Drape  REFERRING PROVIDER: Herbie Drape  REFERRING DIAG: W09.81- Left sided sciatica  Rationale for Evaluation and Treatment:  Rehabilitation  THERAPY DIAG:  Pain in left leg  Numbness and tingling of left lower extremity  Sciatica, left side  ONSET DATE: November 2023  SUBJECTIVE:                                                                                                                                                                                           SUBJECTIVE STATEMENT: The leg is still tingling in my foot, it is on and off.   PERTINENT HISTORY:  No remarkable history   PAIN:  Are you  having pain? Yes: NPRS scale: 1/10 Pain location: left lateral thigh and SIJ  Pain description: numbness and tingling constantly Aggravating factors: walking for too long, stairs can be hard sometimes Relieving factors: nothing has helped  PRECAUTIONS: None  WEIGHT BEARING RESTRICTIONS: No  FALLS:  Has patient fallen in last 6 months? 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 No significant lumbar spine disc protrusion, foraminal stenosis or central canal stenosis. 2. No acute osseous injury of the lumbar spine   SCREENING FOR RED FLAGS: Bowel or bladder incontinence: No Spinal tumors: No Cauda equina syndrome: No Compression fracture: No Abdominal aneurysm: No  COGNITION: Overall cognitive status: Within functional limits for tasks assessed     SENSATION: Light touch: sensitive to touch on lateral leg  MUSCLE LENGTH: Hamstrings: mild tightness bilaterally  POSTURE: No Significant postural limitations  PALPATION: Some pressure at L SIJ   LUMBAR ROM:   AROM eval  Flexion WNL  Extension WNL  Right lateral flexion WNL  Left lateral flexion WNL  Right rotation  WNL  Left rotation WNL   (Blank rows = not tested)  LOWER EXTREMITY ROM:   grossly WFL   LOWER EXTREMITY MMT:  grossly 5/5   LUMBAR SPECIAL TESTS:  Straight leg raise test: Negative, Slump test: Negative, and FABER test: Positive  FUNCTIONAL TESTS:  5 times sit to stand: 10.26s  TODAY'S TREATMENT:                                                                                                                              DATE:  02/01/23 Bike L4 x3mins  Walk outside Passive stretching to HS, glutes, piriformis  Manual massage with tennis ball and then with massage gun Sciatic nerve glides x10 on LLE  STS on airex x10  01/24/23 NuStep L5x69mins Step ups from airex 6" Lateral step ups 6" STS with resistance at knees 2x10 Bridges with green band 2x10 Sidelying clamshells 2x10  Passive Stretching- HS, piriformis, IT band, glutes, trunk rotations, hip flexor    01/17/23 Bike L4 x58mins  Resisted gait 20# 4 way x4 Leg ext 10# 2x10  HS curls 20# 2x10  Leg press 20# 2x10  Calf stretch 30s Calf raises 2x10 Walking on beam Lateral step ups 6"   01/10/23 NuStep L5 x37mins Seated HS and piriformis stretch 30s each side  Lateral band walks green  Step ups 6"  Calf stretch 30s Calf raises 2x10 STS on airex 2x10 2 way hip 2# 2x10  12/24/22 NuStep L4 x48mins  Passive stretching HS, IT band, piriformis 30s SKTC 30s Clamshells red 2x10  Bridges 2x10  Supine ball adduction 2x10  SLR 2x10 SL abd 2x10 Supine trunk rotations    12/15/22- EVAL    PATIENT EDUCATION:  Education details: POC Person educated: Patient Education method: Explanation Education comprehension: verbalized understanding  HOME EXERCISE PROGRAM: Access Code: [email or web address] URL: [email or web address] Date: 12/24/2022 Prepared by: Cassie Freer  Exercises - Supine Bridge  - 1 x daily - 7 x weekly - 2 sets - 10 reps - Supine Lower Trunk Rotation  - 1 x daily - 7 x weekly - 2 sets - 10 reps -  Clamshell  - 1 x daily - 7 x weekly - 2 sets - 10 reps - Supine Hip Adduction Isometric with Ball  - 1 x daily - 7 x weekly - 2 sets - 10 reps - Sidelying Hip Abduction  - 1 x daily - 7 x weekly - 2 sets - 10 reps - Supine Active Straight Leg Raise  - 1 x daily - 7 x weekly - 2 sets - 10 reps  ASSESSMENT:  CLINICAL IMPRESSION: Patient continues to have complaints of numbness and tingling in her leg. Backed off with heavy strengthening today and focused more on stretching and loosening up tightness in her glutes and piriformis. She has lots of tightness and jumps with pressure on her L glute and piriformis. Says there is pain and has numbness into her foot when I pushed into her piriformis.      REHAB POTENTIAL: Good  CLINICAL DECISION MAKING: Stable/uncomplicated  EVALUATION COMPLEXITY: Low   GOALS: Goals reviewed with patient? Yes  SHORT TERM GOALS: Target date: 01/05/23  Patient will be independent with initial HEP.  Goal status: MET  2.  Patient will report centralization of radicular symptoms.  Baseline: N/T in left leg down to foot Goal status: IN PROGRESS   LONG TERM GOALS: Target date: 02/02/23  Patient will be independent with advanced/ongoing HEP to improve outcomes and carryover.  Goal status: INITIAL  2.  Patient will report 75% improvement in left leg numbness and tingling Goal status: INITIAL   3.  Patient will tolerate 30 min of (standing/sitting/walking) without an increase in numbness/tingling Baseline: power walk increased sx Goal status: INITIAL  PLAN:  PT FREQUENCY: 1x/week  PT DURATION: 6 weeks  PLANNED INTERVENTIONS: Therapeutic exercises, Therapeutic activity, Neuromuscular re-education, Balance training, Gait training, Patient/Family education, Self Care, Joint mobilization, Stair training, Dry Needling, Electrical stimulation, Cryotherapy, Moist heat, Traction, Ultrasound, Ionotophoresis 4mg /ml Dexamethasone, and Manual therapy.  PLAN FOR NEXT  SESSION: strengthening and stretching LE/hip   Cassie Freer, PT 02/01/2023, 10:17 AM

## 2023-02-04 NOTE — Therapy (Signed)
OUTPATIENT PHYSICAL THERAPY THORACOLUMBAR TREATMENT   Patient Name: Maria Mccarty MRN: [id number] DOB:Oct 10, 1970, 52 y.o., female Today's Date: 02/07/2023  END OF SESSION:  PT End of Session - 02/07/23 0801     Visit Number 7    Date for PT Re-Evaluation 03/14/23    Authorization Type [place] Medicaid    PT Start Time 0800    PT Stop Time 0845    PT Time Calculation (min) 45 min    Activity Tolerance Patient tolerated treatment well    Behavior During Therapy WFL for tasks assessed/performed                 Past Medical History:  Diagnosis Date   Acid reflux    Hypertension    Vaginal Pap smear, abnormal    Past Surgical History:  Procedure Laterality Date   35 HOUR PH STUDY N/A 04/24/2018   Procedure: 24 HOUR PH STUDY;  Surgeon: Napoleon Form, MD;  [facility];  Service: Endoscopy;  Laterality: N/A;   CERVICAL BIOPSY  W/ LOOP ELECTRODE EXCISION     COLPOSCOPY     ESOPHAGEAL MANOMETRY N/A 04/24/2018   Procedure: ESOPHAGEAL MANOMETRY (EM);  Surgeon: Napoleon Form, MD;  [facility];  Service: Endoscopy;  Laterality: N/A;   LEEP     PH IMPEDANCE STUDY  04/24/2018   Procedure: PH IMPEDANCE STUDY;  Surgeon: Napoleon Form, MD;  [facility];  Service: Endoscopy;;   TONSILLECTOMY     Patient Active Problem List   Diagnosis Date Noted   Uterine fibroid 12/06/2022   Left sided sciatica 12/06/2022   Cervical spondylosis 10/05/2022   Hyponatremia 02/17/2022   Pharyngeal dysphagia 01/05/2022   Anxiety, generalized 09/11/2020   Essential hypertension 01/02/2020   Gastroesophageal reflux disease    Laryngopharyngeal reflux (LPR) 05/01/2018   Borderline hyperlipidemia 02/23/2018   Gastroparesis 02/22/2018   Chronic idiopathic constipation 12/21/2017   S/P LEEP 12/21/2016    PCP: Herbie Drape  REFERRING PROVIDER: Herbie Drape  REFERRING DIAG: N82.95- Left sided sciatica  Rationale for Evaluation and Treatment:  Rehabilitation  THERAPY DIAG:  Pain in left leg  Numbness and tingling of left lower extremity  Sciatica, left side  ONSET DATE: November 2023  SUBJECTIVE:                                                                                                                                                                                           SUBJECTIVE STATEMENT: The leg is still tingling in all the way down in to my foot. I think I am going to go back to my doctor for  an MRI  PERTINENT HISTORY:  No remarkable history   PAIN:  Are you having pain? Yes: NPRS scale: 1/10 Pain location: left lateral thigh and SIJ  Pain description: numbness and tingling constantly Aggravating factors: walking for too long, stairs can be hard sometimes Relieving factors: nothing has helped  PRECAUTIONS: None  WEIGHT BEARING RESTRICTIONS: No  FALLS:  Has patient fallen in last 6 months? 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 No significant lumbar spine disc protrusion, foraminal stenosis or central canal stenosis. 2. No acute osseous injury of the lumbar spine   SCREENING FOR RED FLAGS: Bowel or bladder incontinence: No Spinal tumors: No Cauda equina syndrome: No Compression fracture: No Abdominal aneurysm: No  COGNITION: Overall cognitive status: Within functional limits for tasks assessed     SENSATION: Light touch: sensitive to touch on lateral leg  MUSCLE LENGTH: Hamstrings: mild tightness bilaterally  POSTURE: No Significant postural limitations  PALPATION: Some pressure at L SIJ   LUMBAR ROM:   AROM eval  Flexion WNL  Extension WNL  Right  lateral flexion WNL  Left lateral flexion WNL  Right rotation WNL  Left rotation WNL   (Blank rows = not tested)  LOWER EXTREMITY ROM:   grossly WFL   LOWER EXTREMITY MMT:  grossly 5/5   LUMBAR SPECIAL TESTS:  Straight leg raise test: Negative, Slump test: Negative, and FABER test: Positive  FUNCTIONAL TESTS:  5 times sit to stand: 10.26s  TODAY'S TREATMENT:                                                                                                                              DATE:  02/07/23 NuStep L5 x46mins  Manual massage to piriformis  Stretching HS, IT, piriformis 30s each  Bridges 2x10 Traction with MH 10 mins lumbar 80#    02/01/23 Bike L4 x58mins  Walk outside Passive stretching to HS, glutes, piriformis  Manual massage with tennis ball and then with massage gun Sciatic nerve glides x10 on LLE  STS on airex x10  01/24/23 NuStep L5x51mins Step ups from airex 6" Lateral step ups 6" STS with resistance at knees 2x10 Bridges with green band 2x10 Sidelying clamshells 2x10  Passive Stretching- HS, piriformis, IT band, glutes, trunk rotations, hip flexor    01/17/23 Bike L4 x68mins  Resisted gait 20# 4 way x4 Leg ext 10# 2x10  HS curls 20# 2x10  Leg press 20# 2x10  Calf stretch 30s Calf raises 2x10 Walking on beam Lateral step ups 6"   01/10/23 NuStep L5 x40mins Seated HS and piriformis stretch 30s each side  Lateral band walks green  Step ups  6"  Calf stretch 30s Calf raises 2x10 STS on airex 2x10 2 way hip 2# 2x10  12/24/22 NuStep L4 x4mins  Passive stretching HS, IT band, piriformis 30s SKTC 30s Clamshells red 2x10  Bridges 2x10  Supine ball adduction 2x10  SLR 2x10 SL abd 2x10 Supine trunk rotations    12/15/22- EVAL    PATIENT EDUCATION:  Education details: POC Person educated: Patient Education method: Explanation Education comprehension: verbalized understanding  HOME EXERCISE PROGRAM: Access Code: [email or web address] URL:  [email or web address] Date: 12/24/2022 Prepared by: Cassie Freer  Exercises - Supine Bridge  - 1 x daily - 7 x weekly - 2 sets - 10 reps - Supine Lower Trunk Rotation  - 1 x daily - 7 x weekly - 2 sets - 10 reps - Clamshell  - 1 x daily - 7 x weekly - 2 sets - 10 reps - Supine Hip Adduction Isometric with Ball  - 1 x daily - 7 x weekly - 2 sets - 10 reps - Sidelying Hip Abduction  - 1 x daily - 7 x weekly - 2 sets - 10 reps - Supine Active Straight Leg Raise  - 1 x daily - 7 x weekly - 2 sets - 10 reps  ASSESSMENT:  CLINICAL IMPRESSION: Patient continues to have complaints of numbness and tingling in her leg, she is frustrated with it. She has lots of tightness and jumps with pressure on her L glute and piriformis. Tried to some manual massage and stretching to help loose her. Decided to do traction today to see if that could help with some of the numbness and tingling. Some improvement at end of visit. Will recert for another month.    REHAB POTENTIAL: Good  CLINICAL DECISION MAKING: Stable/uncomplicated  EVALUATION COMPLEXITY: Low   GOALS: Goals reviewed with patient? Yes  SHORT TERM GOALS: Target date: 01/05/23  Patient will be independent with initial HEP.  Goal status: MET  2.  Patient will report centralization of radicular symptoms.  Baseline: N/T in left leg down to foot Goal status: IN PROGRESS   LONG TERM GOALS: Target date: 03/14/23  Patient will be independent with advanced/ongoing HEP to improve outcomes and carryover.  Goal status: INITIAL  2.  Patient will report 75% improvement in left leg numbness and tingling Goal status: IN PROGRESS   3.  Patient will tolerate 30 min of (standing/sitting/walking) without an increase in numbness/tingling Baseline: power walk increased sx Goal status: IN PROGRESS  PLAN:  PT FREQUENCY: 1x/week  PT DURATION: 6 weeks  PLANNED INTERVENTIONS: Therapeutic exercises, Therapeutic activity, Neuromuscular  re-education, Balance training, Gait training, Patient/Family education, Self Care, Joint mobilization, Stair training, Dry Needling, Electrical stimulation, Cryotherapy, Moist heat, Traction, Ultrasound, Ionotophoresis 4mg /ml Dexamethasone, and Manual therapy.  PLAN FOR NEXT SESSION: strengthening and stretching LE/hip   Cassie Freer, PT 02/07/2023, 8:47 AM

## 2023-02-07 ENCOUNTER — Ambulatory Visit: Payer: Medicaid Other | Attending: Family Medicine

## 2023-02-07 DIAGNOSIS — M79605 Pain in left leg: Secondary | ICD-10-CM | POA: Diagnosis not present

## 2023-02-07 DIAGNOSIS — R202 Paresthesia of skin: Secondary | ICD-10-CM | POA: Insufficient documentation

## 2023-02-07 DIAGNOSIS — R200 Anesthesia of skin: Secondary | ICD-10-CM | POA: Diagnosis not present

## 2023-02-07 DIAGNOSIS — M5432 Sciatica, left side: Secondary | ICD-10-CM | POA: Diagnosis not present

## 2023-02-11 ENCOUNTER — Telehealth: Payer: Self-pay | Admitting: Family Medicine

## 2023-02-11 NOTE — Telephone Encounter (Signed)
Pt is wanting a referral to [facility] [address] ([address] 2) [place], [place] [address] Phone: [phone]. She is having pain in her L left with pins and needles all the way down to her toes. Please advise pt at [phone].

## 2023-02-14 ENCOUNTER — Ambulatory Visit: Payer: Medicaid Other

## 2023-02-14 ENCOUNTER — Ambulatory Visit: Payer: Medicaid Other | Admitting: Family Medicine

## 2023-02-14 ENCOUNTER — Encounter: Payer: Self-pay | Admitting: Family Medicine

## 2023-02-14 VITALS — BP 152/90 | HR 72 | Temp 98.2°F | Ht 63.0 in | Wt 142.0 lb

## 2023-02-14 DIAGNOSIS — I10 Essential (primary) hypertension: Secondary | ICD-10-CM | POA: Diagnosis not present

## 2023-02-14 DIAGNOSIS — R202 Paresthesia of skin: Secondary | ICD-10-CM

## 2023-02-14 DIAGNOSIS — E538 Deficiency of other specified B group vitamins: Secondary | ICD-10-CM | POA: Diagnosis not present

## 2023-02-14 DIAGNOSIS — R200 Anesthesia of skin: Secondary | ICD-10-CM | POA: Insufficient documentation

## 2023-02-14 MED ORDER — SPIRONOLACTONE 25 MG PO TABS: 25.0000 mg | ORAL_TABLET | Freq: Every day | ORAL | 3 refills | Status: DC

## 2023-02-14 NOTE — Assessment & Plan Note (Signed)
BP is back to being high on lisinopril monotherapy. I will try her on spironolactone and see if this diuretic is more tolerable. I will pan to recheck her BMP at her next visit in 1 month.

## 2023-02-14 NOTE — Progress Notes (Signed)
[facility] [address] [place] [address] Dept: [phone] Dept Fax: [phone]  Office Visit  Subjective:    Patient ID: Maria Mccarty, female    DOB: 05/04/1971, 52 y.o..   MRN: [id number]  Chief Complaint  Patient presents with   Leg Pain    C/o having pain/tingling from lower back into legs/feet x 1 week.  Also having elevated BP at home.    History of Present Illness:  Patient is in today complaining of progressive issues with numbness and tingling in her legs. Ms. Amador Cunas has a history of a pain in her left leg, starting in Oct. after she made a twisting movement. She finds she has pain that is burning and pin-prick in quality in the posterolateral left thigh, radiating down to her foot and up to her SI joint/lower back area at times. We did a trial of physical therapy. She has not found this to be generally helpful. She describes a recent spinal distraction/traction effort. Since then, she notes similar issues with tingling and numbness intermittently in the right leg. She also notes some burning sensation at times in the feet. She is feeling very frustrated by the ongoing issues. She notes this has led to a decrease in physical activity, which she notes is contributing to weight gain.  Ms. Bayes has a history of hypertension. She is currently managed on lisinopril 40 mg daily. She was previously on HCTZ, but had developed chronic hyponatremia. I tried prescribing amlodipine, but she developed ankle swelling and constipation. She has been monitoring her BP at home, getting values similar to what we saw int he office today.  Past Medical History: Patient Active Problem List   Diagnosis Date Noted   Uterine fibroid 12/06/2022   Left sided sciatica 12/06/2022   Cervical spondylosis 10/05/2022   Hyponatremia 02/17/2022   Pharyngeal dysphagia 01/05/2022   Anxiety, generalized 09/11/2020   Essential hypertension  01/02/2020   Gastroesophageal reflux disease    Laryngopharyngeal reflux (LPR) 05/01/2018   Borderline hyperlipidemia 02/23/2018   Gastroparesis 02/22/2018   Chronic idiopathic constipation 12/21/2017   S/P LEEP 12/21/2016   Past Surgical History:  Procedure Laterality Date   24 HOUR PH STUDY N/A 04/24/2018   Procedure: 24 HOUR PH STUDY;  Surgeon: Napoleon Form, MD;  Location: [facility];  Service: Endoscopy;  Laterality: N/A;   CERVICAL BIOPSY  W/ LOOP ELECTRODE EXCISION     COLPOSCOPY     ESOPHAGEAL MANOMETRY N/A 04/24/2018   Procedure: ESOPHAGEAL MANOMETRY (EM);  Surgeon: Napoleon Form, MD;  Location: [facility];  Service: Endoscopy;  Laterality: N/A;   LEEP     PH IMPEDANCE STUDY  04/24/2018   Procedure: PH IMPEDANCE STUDY;  Surgeon: Napoleon Form, MD;  Location: [facility];  Service: Endoscopy;;   TONSILLECTOMY     Family History  Problem Relation Age of Onset   Cancer Mother        Colon   Alcohol abuse Mother    Hypertension Mother    Peripheral Artery Disease Mother    Cancer Brother        Kidney   Heart disease Brother    Hypertension Brother    Hypertension Brother    Stroke Paternal Grandmother    Diabetes Paternal Grandfather    Outpatient Medications Prior to Visit  Medication Sig Dispense Refill   Cholecalciferol (VITAMIN D) 125 MCG (5000 UT) CAPS Take 1 capsule by mouth daily.     hydrOXYzine (  ATARAX) 10 MG tablet Take 1 tablet (10 mg total) by mouth 3 (three) times daily as needed for anxiety. 90 tablet 1   lisinopril (ZESTRIL) 40 MG tablet Take 1 tablet (40 mg total) by mouth daily. 90 tablet 3   NEXIUM 40 MG capsule Take 1 capsule (40 mg total) by mouth daily as needed. 90 capsule 1   No facility-administered medications prior to visit.   Allergies  Allergen Reactions   Penicillins Rash     Objective:   Today's Vitals   02/14/23 1323  BP: (!) 152/90  Pulse: 72  Temp: 98.2 F (36.8 C)  TempSrc: Temporal  SpO2: 100%   Weight: 142 lb (64.4 kg)  Height: 5\' 3"  (1.6 m)   Body mass index is 25.15 kg/m.   General: Well developed, well nourished. No acute distress. Extremities: Full ROM. Strength 5/5. Neuro: LE sensation normal and DTR 2+bilaterally. Psych: Alert and oriented. Normal mood and affect.  Health Maintenance Due  Topic Date Due   HIV Screening  Never done   Hepatitis C Screening  Never done   Zoster Vaccines- Shingrix (1 of 2) Never done     Assessment & Plan:   Problem List Items Addressed This Visit       Cardiovascular and Mediastinum   Essential hypertension    BP is back to being high on lisinopril monotherapy. I will try her on spironolactone and see if this diuretic is more tolerable. I will pan to recheck her BMP at her next visit in 1 month.      Relevant Medications   spironolactone (ALDACTONE) 25 MG tablet     Other   Numbness and tingling of both legs - Primary    Although initially, this had appeared to be a sciatica, it is less clear now. She has some features of meralgia paresthetica in the left thigh, but also features of neuropathy in her feet. I will check some screening labs for underlying causes of neuropathy. I will refer her to neurology to consider NCS to evaluate for neuropathy.      Relevant Orders   Vitamin B12   Folate   Vitamin B1   TSH   T4, free   Comprehensive metabolic panel   Ambulatory referral to Neurology    Return in about 4 weeks (around 03/14/2023) for Reassessment, labs.   Loyola Mast, MD

## 2023-02-14 NOTE — Assessment & Plan Note (Signed)
Although initially, this had appeared to be a sciatica, it is less clear now. She has some features of meralgia paresthetica in the left thigh, but also features of neuropathy in her feet. I will check some screening labs for underlying causes of neuropathy. I will refer her to neurology to consider NCS to evaluate for neuropathy.

## 2023-02-14 NOTE — Telephone Encounter (Signed)
Has appointment today.  Dm/cma ° °

## 2023-02-15 DIAGNOSIS — E538 Deficiency of other specified B group vitamins: Secondary | ICD-10-CM | POA: Insufficient documentation

## 2023-02-15 LAB — COMPREHENSIVE METABOLIC PANEL
ALT: 12 U/L (ref 0–35)
AST: 16 U/L (ref 0–37)
Albumin: 4.5 g/dL (ref 3.5–5.2)
Alkaline Phosphatase: 73 U/L (ref 39–117)
BUN: 8 mg/dL (ref 6–23)
CO2: 28 mEq/L (ref 19–32)
Calcium: 9.6 mg/dL (ref 8.4–10.5)
Chloride: 104 mEq/L (ref 96–112)
Creatinine, Ser: 0.7 mg/dL (ref 0.40–1.20)
GFR: 98.7 mL/min (ref >60.00)
Glucose, Bld: 90 mg/dL (ref 70–99)
Potassium: 4.5 mEq/L (ref 3.5–5.1)
Sodium: 140 mEq/L (ref 135–145)
Total Bilirubin: 0.5 mg/dL (ref 0.2–1.2)
Total Protein: 6.9 g/dL (ref 6.0–8.3)

## 2023-02-15 LAB — T4, FREE: Free T4: 0.77 ng/dL (ref 0.60–1.60)

## 2023-02-15 LAB — FOLATE: Folate: 15.1 ng/mL (ref >5.9)

## 2023-02-15 LAB — TSH: TSH: 4.23 u[IU]/mL (ref 0.35–5.50)

## 2023-02-15 LAB — VITAMIN B12: Vitamin B-12: 148 pg/mL — ABNORMAL LOW (ref 211–911)

## 2023-02-15 MED ORDER — CYANOCOBALAMIN 1000 MCG/ML IJ SOLN: 1000.0000 ug | Freq: Once | INTRAMUSCULAR | Status: DC

## 2023-02-15 NOTE — Addendum Note (Signed)
Addended by: Loyola Mast on: 02/15/2023 03:25 PM   Modules accepted: Orders

## 2023-02-16 ENCOUNTER — Ambulatory Visit (INDEPENDENT_AMBULATORY_CARE_PROVIDER_SITE_OTHER): Payer: Medicaid Other

## 2023-02-16 DIAGNOSIS — E538 Deficiency of other specified B group vitamins: Secondary | ICD-10-CM

## 2023-02-16 MED ORDER — CYANOCOBALAMIN 1000 MCG/ML IJ SOLN
1000.0000 ug | Freq: Once | INTRAMUSCULAR | Status: AC
  Administered 2023-02-16: 1000 ug via INTRAMUSCULAR

## 2023-02-16 NOTE — Progress Notes (Signed)
After obtaining consent, and per orders of Dr. Rudd, injection of B12 given by Myangel Summons. Patient instructed to remain in clinic for 20 minutes afterwards, and to report any adverse reaction to me immediately.  °

## 2023-02-17 LAB — EXTRA SPECIMEN

## 2023-02-17 LAB — VITAMIN B1: Vitamin B1 (Thiamine): 12 nmol/L (ref 8–30)

## 2023-02-17 NOTE — Therapy (Signed)
OUTPATIENT PHYSICAL THERAPY THORACOLUMBAR TREATMENT   Patient Name: Maria Mccarty MRN: [id number] DOB:[birth year], 52 y.o., female Today's Date: 02/18/2023  END OF SESSION:  PT End of Session - 02/18/23 0847     Visit Number 8    Date for PT Re-Evaluation 03/14/23    Authorization Type [place] Medicaid    PT Start Time [phone]    PT Stop Time 0930    PT Time Calculation (min) 43 min    Activity Tolerance Patient tolerated treatment well    Behavior During Therapy WFL for tasks assessed/performed                  Past Medical History:  Diagnosis Date   Acid reflux    Hypertension    Vaginal Pap smear, abnormal    Past Surgical History:  Procedure Laterality Date   68 HOUR PH STUDY N/A 04/24/2018   Procedure: 24 HOUR PH STUDY;  Surgeon: Napoleon Form, MD;  Location: [facility];  Service: Endoscopy;  Laterality: N/A;   CERVICAL BIOPSY  W/ LOOP ELECTRODE EXCISION     COLPOSCOPY     ESOPHAGEAL MANOMETRY N/A 04/24/2018   Procedure: ESOPHAGEAL MANOMETRY (EM);  Surgeon: Napoleon Form, MD;  Location: [facility];  Service: Endoscopy;  Laterality: N/A;   LEEP     PH IMPEDANCE STUDY  04/24/2018   Procedure: PH IMPEDANCE STUDY;  Surgeon: Napoleon Form, MD;  Location: [facility];  Service: Endoscopy;;   TONSILLECTOMY     Patient Active Problem List   Diagnosis Date Noted   Vitamin B12 deficiency 02/15/2023   Numbness and tingling of both legs 02/14/2023   Uterine fibroid 12/06/2022   Left sided sciatica 12/06/2022   Cervical spondylosis 10/05/2022   Hyponatremia 02/17/2022   Pharyngeal dysphagia 01/05/2022   Anxiety, generalized 09/11/2020   Essential hypertension 01/02/2020   Gastroesophageal reflux disease    Laryngopharyngeal reflux (LPR) 05/01/2018   Borderline hyperlipidemia 02/23/2018   Gastroparesis 02/22/2018   Chronic idiopathic constipation 12/21/2017   S/P LEEP 12/21/2016    PCP: Herbie Drape  REFERRING PROVIDER: Herbie Drape  REFERRING DIAG: M84.13- Left sided sciatica  Rationale for Evaluation and Treatment: Rehabilitation  THERAPY DIAG:  Pain in left leg  Numbness and tingling of left lower extremity  Sciatica, left side  ONSET DATE: November 2023  SUBJECTIVE:                                                                                                                                                                                           SUBJECTIVE STATEMENT: I am frustrated, they sent me to neurology but I  cannot get an appt until August. I feel like the outside of my leg burns especially if I touch it.   PERTINENT HISTORY:  No remarkable history   PAIN:  Are you having pain? Yes: NPRS scale: 1/10 Pain location: left lateral thigh and SIJ  Pain description: numbness and tingling constantly Aggravating factors: walking for too long, stairs can be hard sometimes Relieving factors: nothing has helped  PRECAUTIONS: None  WEIGHT BEARING RESTRICTIONS: No  FALLS:  Has patient fallen in last 6 months? 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 No significant lumbar spine disc protrusion, foraminal stenosis or central canal stenosis. 2. No acute osseous injury of the lumbar spine   SCREENING FOR RED FLAGS: Bowel or bladder incontinence: No Spinal tumors: No Cauda equina syndrome: No Compression fracture: No Abdominal aneurysm: No  COGNITION: Overall cognitive status: Within functional limits for tasks assessed     SENSATION: Light touch: sensitive to touch on lateral leg  MUSCLE LENGTH: Hamstrings: mild tightness bilaterally  POSTURE: No Significant  postural limitations  PALPATION: Some pressure at L SIJ   LUMBAR ROM:   AROM eval  Flexion WNL  Extension WNL  Right lateral flexion WNL  Left lateral flexion WNL  Right rotation WNL  Left rotation WNL   (Blank rows = not tested)  LOWER EXTREMITY ROM:   grossly WFL   LOWER EXTREMITY MMT:  grossly 5/5   LUMBAR SPECIAL TESTS:  Straight leg raise test: Negative, Slump test: Negative, and FABER test: Positive  FUNCTIONAL TESTS:  5 times sit to stand: 10.26s  TODAY'S TREATMENT:                                                                                                                              DATE:  02/18/23 Walking outside big lap around back building NuStep L5x63mins Standing IT band stretch 30s  Stretching HS, glutes, hip flexor 30s each SLR 2x10 STM to glutes and IT band  Sidelying hip abd 2x10  Half kneeling hip flexor stretch 30s Calf stretch 30s Calf raises 2x10 Step ups 6"   02/07/23 NuStep L5 x88mins  Manual massage to piriformis  Stretching HS, IT, piriformis 30s each  Bridges 2x10 Traction with MH 10 mins lumbar 80#    02/01/23 Bike L4 x35mins  Walk outside Passive stretching to HS, glutes, piriformis  Manual massage with tennis ball and then with massage gun Sciatic nerve glides x10 on LLE  STS on airex x10  01/24/23 NuStep L5x14mins Step ups from airex 6" Lateral step ups 6" STS with resistance at knees 2x10 Bridges with green band 2x10 Sidelying clamshells 2x10  Passive Stretching-  HS, piriformis, IT band, glutes, trunk rotations, hip flexor    01/17/23 Bike L4 x52mins  Resisted gait 20# 4 way x4 Leg ext 10# 2x10  HS curls 20# 2x10  Leg press 20# 2x10  Calf stretch 30s Calf raises 2x10 Walking on beam Lateral step ups 6"   01/10/23 NuStep L5 x53mins Seated HS and piriformis stretch 30s each side  Lateral band walks green  Step ups 6"  Calf stretch 30s Calf raises 2x10 STS on airex 2x10 2 way hip 2# 2x10  12/24/22 NuStep L4  x71mins  Passive stretching HS, IT band, piriformis 30s SKTC 30s Clamshells red 2x10  Bridges 2x10  Supine ball adduction 2x10  SLR 2x10 SL abd 2x10 Supine trunk rotations    12/15/22- EVAL    PATIENT EDUCATION:  Education details: POC Person educated: Patient Education method: Explanation Education comprehension: verbalized understanding  HOME EXERCISE PROGRAM: Access Code: [email or web address] URL: [email or web address] Date: 12/24/2022 Prepared by: Cassie Freer  Exercises - Supine Bridge  - 1 x daily - 7 x weekly - 2 sets - 10 reps - Supine Lower Trunk Rotation  - 1 x daily - 7 x weekly - 2 sets - 10 reps - Clamshell  - 1 x daily - 7 x weekly - 2 sets - 10 reps - Supine Hip Adduction Isometric with Ball  - 1 x daily - 7 x weekly - 2 sets - 10 reps - Sidelying Hip Abduction  - 1 x daily - 7 x weekly - 2 sets - 10 reps - Supine Active Straight Leg Raise  - 1 x daily - 7 x weekly - 2 sets - 10 reps  ASSESSMENT:  CLINICAL IMPRESSION: Patient continues to have complaints of numbness and tingling in her leg, she is frustrated with it. I think she may have meralgia paresthetica based off of how she describes her symptoms but again a lot of the interventions we have done should have provided some help. We continued to do some gentle stretching and strengthening with no weights. Patient states she wants to cancel her appts until she can get a clear answer from neurology what is wrong because she has a visit limit with her insurance.    REHAB POTENTIAL: Good  CLINICAL DECISION MAKING: Stable/uncomplicated  EVALUATION COMPLEXITY: Low   GOALS: Goals reviewed with patient? Yes  SHORT TERM GOALS: Target date: 01/05/23  Patient will be independent with initial HEP.  Goal status: MET  2.  Patient will report centralization of radicular symptoms.  Baseline: N/T in left leg down to foot Goal status: IN PROGRESS   LONG TERM GOALS: Target date: 03/14/23  Patient will be  independent with advanced/ongoing HEP to improve outcomes and carryover.  Goal status: INITIAL  2.  Patient will report 75% improvement in left leg numbness and tingling Goal status: IN PROGRESS   3.  Patient will tolerate 30 min of (standing/sitting/walking) without an increase in numbness/tingling Baseline: power walk increased sx Goal status: IN PROGRESS  PLAN:  PT FREQUENCY: 1x/week  PT DURATION: 6 weeks  PLANNED INTERVENTIONS: Therapeutic exercises, Therapeutic activity, Neuromuscular re-education, Balance training, Gait training, Patient/Family education, Self Care, Joint mobilization, Stair training, Dry Needling, Electrical stimulation, Cryotherapy, Moist heat, Traction, Ultrasound, Ionotophoresis 4mg /ml Dexamethasone, and Manual therapy.  PLAN FOR NEXT SESSION: strengthening and stretching LE/hip   Cassie Freer, PT 02/18/2023, 9:29 AM

## 2023-02-18 ENCOUNTER — Telehealth: Payer: Self-pay | Admitting: Family Medicine

## 2023-02-18 ENCOUNTER — Ambulatory Visit: Payer: Medicaid Other

## 2023-02-18 ENCOUNTER — Ambulatory Visit (INDEPENDENT_AMBULATORY_CARE_PROVIDER_SITE_OTHER): Payer: Medicaid Other

## 2023-02-18 DIAGNOSIS — M79605 Pain in left leg: Secondary | ICD-10-CM

## 2023-02-18 DIAGNOSIS — R202 Paresthesia of skin: Secondary | ICD-10-CM

## 2023-02-18 DIAGNOSIS — M5432 Sciatica, left side: Secondary | ICD-10-CM

## 2023-02-18 DIAGNOSIS — R200 Anesthesia of skin: Secondary | ICD-10-CM

## 2023-02-18 DIAGNOSIS — E538 Deficiency of other specified B group vitamins: Secondary | ICD-10-CM | POA: Diagnosis not present

## 2023-02-18 MED ORDER — CYANOCOBALAMIN 1000 MCG/ML IJ SOLN
1000.0000 ug | Freq: Once | INTRAMUSCULAR | Status: AC
  Administered 2023-02-18: 1000 ug via INTRAMUSCULAR

## 2023-02-18 NOTE — Telephone Encounter (Signed)
Referral moved to [facility] but she needs to cancel her appt with [facility]

## 2023-02-18 NOTE — Progress Notes (Signed)
..    Nurse Visit   Date of Encounter: 02/18/2023 ID: Maria Mccarty [place], DOB 01-19-1971, MRN [id number]  PCP:  Loyola Mast, MD   [facility] Providers Cardiologist:  None      Visit Details   VS:  LMP 12/05/2017 (Exact Date)  , BMI There is no height or weight on file to calculate BMI.  Wt Readings from Last 3 Encounters:  02/14/23 142 lb (64.4 kg)  01/27/23 141 lb 3.2 oz (64 kg)  12/21/22 138 lb 9.6 oz (62.9 kg)     Reason for visit: B12 injection Performed today: injection Changes (medications, testing, etc.) : N/A Length of Visit: 10 minutes    Medications Adjustments/Labs and Tests Ordered: No orders of the defined types were placed in this encounter.  No orders of the defined types were placed in this encounter.    Levi Aland, CMA  02/18/2023 1:03 PM

## 2023-02-18 NOTE — Telephone Encounter (Signed)
Pt has been referred to Maria Mccarty on 02/14/23 by Dr Veto Kemps. They can not see her before three months. She is wanting a referral placed to [facility].

## 2023-02-21 ENCOUNTER — Ambulatory Visit (INDEPENDENT_AMBULATORY_CARE_PROVIDER_SITE_OTHER): Payer: Medicaid Other

## 2023-02-21 ENCOUNTER — Ambulatory Visit: Payer: Medicaid Other

## 2023-02-21 DIAGNOSIS — E538 Deficiency of other specified B group vitamins: Secondary | ICD-10-CM

## 2023-02-21 MED ORDER — CYANOCOBALAMIN 1000 MCG/ML IJ SOLN
1000.0000 ug | Freq: Once | INTRAMUSCULAR | Status: AC
  Administered 2023-02-21: 1000 ug via INTRAMUSCULAR

## 2023-02-21 NOTE — Telephone Encounter (Signed)
Will have her call and cancel the [facility] appointment.  Do you know how far out [place] is scheduled?  Please and thank you.  Dm/cma

## 2023-02-21 NOTE — Progress Notes (Signed)
Per orders of Herbie Drape, injection of B12 given by Mickle Plumb, cma.  Patient tolerated injection well.  She will return in 1 week for next weekly B12 shot. Dm/cma

## 2023-02-24 NOTE — Telephone Encounter (Signed)
Patient notified VIA phone and will call [facility] to speak with them. Dm/cma

## 2023-02-25 ENCOUNTER — Ambulatory Visit: Payer: Medicaid Other

## 2023-02-28 ENCOUNTER — Ambulatory Visit: Payer: Medicaid Other

## 2023-03-01 ENCOUNTER — Telehealth: Payer: Self-pay | Admitting: Family Medicine

## 2023-03-01 ENCOUNTER — Ambulatory Visit (INDEPENDENT_AMBULATORY_CARE_PROVIDER_SITE_OTHER): Payer: Medicaid Other

## 2023-03-01 DIAGNOSIS — E538 Deficiency of other specified B group vitamins: Secondary | ICD-10-CM

## 2023-03-01 MED ORDER — CYANOCOBALAMIN 1000 MCG/ML IJ SOLN
1000.0000 ug | Freq: Once | INTRAMUSCULAR | Status: AC
  Administered 2023-03-01: 1000 ug via INTRAMUSCULAR

## 2023-03-01 NOTE — Telephone Encounter (Signed)
Spoke to pharmacist, advised that we will be monitoring this and was well aware of her being on both medications.   Dm/cma

## 2023-03-01 NOTE — Progress Notes (Signed)
Per orders of Herbie Drape, injection of B12 given by Mickle Plumb, cma.  Patient tolerated injection well.   She will return in 1 week. Dm/cma

## 2023-03-01 NOTE — Telephone Encounter (Signed)
[facility] [phone] called in wanting Dr Veto Kemps to know-pt is taking spironolactone (ALDACTONE) 25 MG tablet [[id number]]  and  lisinopril (ZESTRIL) 40 MG tablet [[id number]]. This could increase her potassium level.  [facility] [address] [place], [place] - [address] [place], [place] [address] Phone: [phone]  Fax: [phone]

## 2023-03-01 NOTE — Telephone Encounter (Signed)
[facility] called in wanting Dr Rudd to know-pt is taking spironolactone (ALDACTONE) 25 MG tablet [[id number]]  and  lisinopril (ZESTRIL) 40 MG tablet [[id number]]. This could increase her potassium level.  [facility] - [place], [place] - [address], [place] [address] Phone: [phone]  Fax: [phone]    

## 2023-03-04 ENCOUNTER — Ambulatory Visit: Payer: Medicaid Other

## 2023-03-07 ENCOUNTER — Ambulatory Visit (INDEPENDENT_AMBULATORY_CARE_PROVIDER_SITE_OTHER): Payer: Medicaid Other

## 2023-03-07 DIAGNOSIS — E538 Deficiency of other specified B group vitamins: Secondary | ICD-10-CM | POA: Diagnosis not present

## 2023-03-07 MED ORDER — CYANOCOBALAMIN 1000 MCG/ML IJ SOLN
1000.0000 ug | Freq: Once | INTRAMUSCULAR | Status: AC
  Administered 2023-03-07: 1000 ug via INTRAMUSCULAR

## 2023-03-07 NOTE — Progress Notes (Signed)
Per orders of Herbie Drape, MD, injection of B12 given by Mickle Plumb, cma.  Patient tolerated injection well.  Will rtn in one week.  Dm/cma

## 2023-03-14 ENCOUNTER — Ambulatory Visit (INDEPENDENT_AMBULATORY_CARE_PROVIDER_SITE_OTHER): Payer: Medicaid Other

## 2023-03-14 DIAGNOSIS — E538 Deficiency of other specified B group vitamins: Secondary | ICD-10-CM | POA: Diagnosis not present

## 2023-03-14 MED ORDER — CYANOCOBALAMIN 1000 MCG/ML IJ SOLN
1000.0000 ug | Freq: Once | INTRAMUSCULAR | Status: AC
  Administered 2023-03-14: 1000 ug via INTRAMUSCULAR

## 2023-03-14 NOTE — Progress Notes (Signed)
Per orders of Maria Drape, MD, injection of B12 given by Mickle Plumb, cma.  Patient tolerated injection well.  She will return for her 4 weekly shot and then schedule to follow up with provider.  Dm/cma

## 2023-03-21 ENCOUNTER — Ambulatory Visit (INDEPENDENT_AMBULATORY_CARE_PROVIDER_SITE_OTHER): Payer: Medicaid Other

## 2023-03-21 DIAGNOSIS — E538 Deficiency of other specified B group vitamins: Secondary | ICD-10-CM | POA: Diagnosis not present

## 2023-03-21 MED ORDER — CYANOCOBALAMIN 1000 MCG/ML IJ SOLN
1000.0000 ug | Freq: Once | INTRAMUSCULAR | Status: AC
  Administered 2023-03-21: 1000 ug via INTRAMUSCULAR

## 2023-03-21 NOTE — Progress Notes (Signed)
Pt here for monthly B12 injection per Dr. Veto Kemps  B12 given IM in the right deltoid.Pt tolerated injection well.  Next B12 injection scheduled for N/A. PCP ordered 4 weekly B12 injections. This is 4 of 4.

## 2023-04-04 ENCOUNTER — Encounter: Payer: Self-pay | Admitting: Family Medicine

## 2023-04-04 ENCOUNTER — Ambulatory Visit: Payer: Medicaid Other | Admitting: Family Medicine

## 2023-04-04 VITALS — BP 122/84 | HR 83 | Temp 97.9°F | Wt 142.8 lb

## 2023-04-04 DIAGNOSIS — G5712 Meralgia paresthetica, left lower limb: Secondary | ICD-10-CM | POA: Diagnosis not present

## 2023-04-04 DIAGNOSIS — I10 Essential (primary) hypertension: Secondary | ICD-10-CM | POA: Diagnosis not present

## 2023-04-04 DIAGNOSIS — E538 Deficiency of other specified B group vitamins: Secondary | ICD-10-CM

## 2023-04-04 LAB — BASIC METABOLIC PANEL
BUN: 10 mg/dL (ref 6–23)
CO2: 28 mEq/L (ref 19–32)
Calcium: 9.5 mg/dL (ref 8.4–10.5)
Chloride: 101 mEq/L (ref 96–112)
Creatinine, Ser: 0.66 mg/dL (ref 0.40–1.20)
GFR: 100.02 mL/min (ref >60.00)
Glucose, Bld: 88 mg/dL (ref 70–99)
Potassium: 4.4 mEq/L (ref 3.5–5.1)
Sodium: 137 mEq/L (ref 135–145)

## 2023-04-04 LAB — VITAMIN B12: Vitamin B-12: 493 pg/mL (ref 211–911)

## 2023-04-04 MED ORDER — VITAMIN B-12 1000 MCG PO TABS: 1000.0000 ug | ORAL_TABLET | Freq: Every day | ORAL | 2 refills | Status: DC

## 2023-04-04 NOTE — Patient Instructions (Signed)
return for repeat lab test in 1 month.

## 2023-04-04 NOTE — Assessment & Plan Note (Signed)
Maria Mccarty has completed a course of B12 injections. I will reassess her B12 level today. She should start taking a daily oral Vitamin B12 supplement.

## 2023-04-04 NOTE — Assessment & Plan Note (Signed)
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 Recommend she avoid tight clothing. She might try Aleve 220 mg bid for 7 days to see if this helps. Neurology consult is pending.

## 2023-04-04 NOTE — Assessment & Plan Note (Signed)
BP is in improved control today. I will continue lisinopril 40 mg daily. I reassured Maria Mccarty about the use of spironolactone. We will conduct appropriate monitoring for any electrolyte changes. I will check these today and repeat this in 1 month after she starts the new medicine.

## 2023-04-04 NOTE — Progress Notes (Signed)
[facility] PRIMARY CARE [facility] [address] [place] [address] Dept: [phone] Dept Fax: [phone]  Chronic Care Office Visit  Subjective:    Patient ID: Maria Mccarty, female    DOB: 1971-05-02, 52 y.o..   MRN: [id number]  Chief Complaint  Patient presents with   Medical Management of Chronic Issues    F/u B12 levels.  Has questions regarding meds?   History of Present Illness:  Patient is in today for reassessment of chronic medical issues.  [name] has an ongoing issue with numbness and tingling in her legs. [name] has a history of a pain in her left leg, starting in Oct. after she made a twisting movement. She finds she has pain that is burning and pin-prick in quality in the posterolateral left thigh, radiating down to her foot and up to her SI joint/lower back area at times. We did a trial of physical therapy. She has not found this to be generally helpful. She also noted some burning sensation at times in the feet. I had referred her to neurology, but that appointment is pending. I did some lab testing, which revealed a low Vitamin B12 level. She has now completed a course of B12 injections.  Today her complaints center more around the burning sensation in her lateral thigh.  Maria Mccarty has a history of hypertension. She is currently managed on lisinopril 40 mg daily. She was previously on HCTZ, but had developed chronic hyponatremia. I tried prescribing amlodipine, but she developed ankle swelling and constipation. I had prescribed spironolactone to see if this would help manage her hypertension better. She notes there was a delay in being able to get this from the pharmacy. She now has the medication, but has not started this yet.She has concernes for potential hyperkalemia and with recurrent hyponatremia.  Past Medical History: Patient Active Problem List   Diagnosis Date Noted   Vitamin B12 deficiency 02/15/2023    Numbness and tingling of both legs 02/14/2023   Uterine fibroid 12/06/2022   Left sided sciatica 12/06/2022   Cervical spondylosis 10/05/2022   Hyponatremia 02/17/2022   Pharyngeal dysphagia 01/05/2022   Anxiety, generalized 09/11/2020   Essential hypertension 01/02/2020   Gastroesophageal reflux disease    Laryngopharyngeal reflux (LPR) 05/01/2018   Borderline hyperlipidemia 02/23/2018   Chronic idiopathic constipation 12/21/2017   S/P LEEP 12/21/2016   Past Surgical History:  Procedure Laterality Date   24 HOUR PH STUDY N/A 04/24/2018   Procedure: 24 HOUR PH STUDY;  Surgeon: Napoleon Form, MD;  Location: [facility];  Service: Endoscopy;  Laterality: N/A;   CERVICAL BIOPSY  W/ LOOP ELECTRODE EXCISION     COLPOSCOPY     ESOPHAGEAL MANOMETRY N/A 04/24/2018   Procedure: ESOPHAGEAL MANOMETRY (EM);  Surgeon: Napoleon Form, MD;  Location: [facility];  Service: Endoscopy;  Laterality: N/A;   LEEP     PH IMPEDANCE STUDY  04/24/2018   Procedure: PH IMPEDANCE STUDY;  Surgeon: Napoleon Form, MD;  Location: [facility];  Service: Endoscopy;;   TONSILLECTOMY     Family History  Problem Relation Age of Onset   Cancer Mother        Colon   Alcohol abuse Mother    Hypertension Mother    Peripheral Artery Disease Mother    Cancer Brother        Kidney   Heart disease Brother    Hypertension Brother    Hypertension Brother    Stroke Paternal Grandmother  Diabetes Paternal Grandfather    Outpatient Medications Prior to Visit  Medication Sig Dispense Refill   Cholecalciferol (VITAMIN D) 125 MCG (5000 UT) CAPS Take 1 capsule by mouth daily.     hydrOXYzine (ATARAX) 10 MG tablet Take 1 tablet (10 mg total) by mouth 3 (three) times daily as needed for anxiety. 90 tablet 1   lisinopril (ZESTRIL) 40 MG tablet Take 1 tablet (40 mg total) by mouth daily. 90 tablet 3   NEXIUM 40 MG capsule Take 1 capsule (40 mg total) by mouth daily as needed. 90 capsule 1   spironolactone  (ALDACTONE) 25 MG tablet Take 1 tablet (25 mg total) by mouth daily. (Patient not taking: Reported on 04/04/2023) 90 tablet 3   cyanocobalamin (VITAMIN B12) injection 1,000 mcg      No facility-administered medications prior to visit.   Allergies  Allergen Reactions   Penicillins Rash   Objective:   Today's Vitals   04/04/23 0918  BP: 122/84  Pulse: 83  Temp: 97.9 F (36.6 C)  TempSrc: Temporal  SpO2: 98%  Weight: 142 lb 12.8 oz (64.8 kg)   Body mass index is 25.3 kg/m.   General: Well developed, well nourished. No acute distress. Psych: Alert and oriented. Normal mood and affect.  Health Maintenance Due  Topic Date Due   HIV Screening  Never done   Hepatitis C Screening  Never done   Zoster Vaccines- Shingrix (1 of 2) Never done     Assessment & Plan:   Problem List Items Addressed This Visit       Cardiovascular and Mediastinum   Essential hypertension    BP is in improved control today. I will continue lisinopril 40 mg daily. I reassured Maria Mccarty about the use of spironolactone. We will conduct appropriate monitoring for any electrolyte changes. I will check these today and repeat this in 1 month after she starts the new medicine.      Relevant Orders   Basic metabolic panel   Basic metabolic panel     Nervous and Auditory   Meralgia paraesthetica, left    Symptoms at present seem more consistent with a lateral femoral cutaneous nerve entrapment. Recommend she avoid tight clothing. She might try Aleve 220 mg bid for 7 days to see if this helps. Neurology consult is pending.        Other   Vitamin B12 deficiency - Primary    Maria Mccarty has completed a course of B12 injections. I will reassess her B12 level today. She should start taking a daily oral Vitamin B12 supplement.      Relevant Medications   cyanocobalamin (VITAMIN B12) 1000 MCG tablet   Other Relevant Orders   Vitamin B12    Return in about 3 months (around 07/05/2023).   Loyola Mast,  MD

## 2023-04-25 ENCOUNTER — Encounter (HOSPITAL_BASED_OUTPATIENT_CLINIC_OR_DEPARTMENT_OTHER): Payer: Self-pay | Admitting: Radiology

## 2023-04-25 ENCOUNTER — Emergency Department (HOSPITAL_BASED_OUTPATIENT_CLINIC_OR_DEPARTMENT_OTHER)
Admission: EM | Admit: 2023-04-25 | Discharge: 2023-04-25 | Disposition: A | Discharge status: Home / Self Care | Payer: Medicaid Other | Attending: Emergency Medicine | Admitting: Emergency Medicine

## 2023-04-25 ENCOUNTER — Other Ambulatory Visit: Payer: Self-pay

## 2023-04-25 ENCOUNTER — Emergency Department (HOSPITAL_BASED_OUTPATIENT_CLINIC_OR_DEPARTMENT_OTHER): Payer: Medicaid Other

## 2023-04-25 DIAGNOSIS — Z79899 Other long term (current) drug therapy: Secondary | ICD-10-CM | POA: Insufficient documentation

## 2023-04-25 DIAGNOSIS — K760 Fatty (change of) liver, not elsewhere classified: Secondary | ICD-10-CM | POA: Insufficient documentation

## 2023-04-25 DIAGNOSIS — R1084 Generalized abdominal pain: Secondary | ICD-10-CM | POA: Insufficient documentation

## 2023-04-25 DIAGNOSIS — R110 Nausea: Secondary | ICD-10-CM | POA: Diagnosis not present

## 2023-04-25 DIAGNOSIS — R1011 Right upper quadrant pain: Secondary | ICD-10-CM | POA: Diagnosis not present

## 2023-04-25 DIAGNOSIS — I10 Essential (primary) hypertension: Secondary | ICD-10-CM | POA: Diagnosis not present

## 2023-04-25 DIAGNOSIS — R1031 Right lower quadrant pain: Secondary | ICD-10-CM | POA: Diagnosis present

## 2023-04-25 LAB — COMPREHENSIVE METABOLIC PANEL
ALT: 23 U/L (ref 0–44)
AST: 22 U/L (ref 15–41)
Albumin: 4.3 g/dL (ref 3.5–5.0)
Alkaline Phosphatase: 67 U/L (ref 38–126)
Anion gap: 9 (ref 5–15)
BUN: 9 mg/dL (ref 6–20)
CO2: 26 mmol/L (ref 22–32)
Calcium: 9.1 mg/dL (ref 8.9–10.3)
Chloride: 102 mmol/L (ref 98–111)
Creatinine, Ser: 0.73 mg/dL (ref 0.44–1.00)
GFR, Estimated: >60 mL/min (ref >60)
Glucose, Bld: 97 mg/dL (ref 70–99)
Potassium: 3.9 mmol/L (ref 3.5–5.1)
Sodium: 137 mmol/L (ref 135–145)
Total Bilirubin: 0.6 mg/dL (ref 0.3–1.2)
Total Protein: 7.2 g/dL (ref 6.5–8.1)

## 2023-04-25 LAB — URINALYSIS, ROUTINE W REFLEX MICROSCOPIC
Bilirubin Urine: NEGATIVE
Glucose, UA: NEGATIVE mg/dL
Ketones, ur: NEGATIVE mg/dL
Leukocytes,Ua: NEGATIVE
Nitrite: NEGATIVE
Protein, ur: NEGATIVE mg/dL
Specific Gravity, Urine: 1.01 (ref 1.005–1.030)
pH: 7 (ref 5.0–8.0)

## 2023-04-25 LAB — CBC WITH DIFFERENTIAL/PLATELET
Abs Immature Granulocytes: 0.02 10*3/uL (ref 0.00–0.07)
Basophils Absolute: 0 10*3/uL (ref 0.0–0.1)
Basophils Relative: 1 %
Eosinophils Absolute: 0 10*3/uL (ref 0.0–0.5)
Eosinophils Relative: 0 %
HCT: 42 % (ref 36.0–46.0)
Hemoglobin: 14.1 g/dL (ref 12.0–15.0)
Immature Granulocytes: 0 %
Lymphocytes Relative: 11 %
Lymphs Abs: 0.7 10*3/uL (ref 0.7–4.0)
MCH: 29.7 pg (ref 26.0–34.0)
MCHC: 33.6 g/dL (ref 30.0–36.0)
MCV: 88.4 fL (ref 80.0–100.0)
Monocytes Absolute: 0.4 10*3/uL (ref 0.1–1.0)
Monocytes Relative: 6 %
Neutro Abs: 5 10*3/uL (ref 1.7–7.7)
Neutrophils Relative %: 82 %
Platelets: 206 10*3/uL (ref 150–400)
RBC: 4.75 MIL/uL (ref 3.87–5.11)
RDW: 12.4 % (ref 11.5–15.5)
WBC: 6.1 10*3/uL (ref 4.0–10.5)
nRBC: 0 % (ref 0.0–0.2)

## 2023-04-25 LAB — URINALYSIS, MICROSCOPIC (REFLEX)

## 2023-04-25 LAB — LIPASE, BLOOD: Lipase: 34 U/L (ref 11–51)

## 2023-04-25 MED ORDER — IOHEXOL 300 MG/ML  SOLN
100.0000 mL | Freq: Once | INTRAMUSCULAR | Status: AC | PRN
  Administered 2023-04-25: 100 mL via INTRAVENOUS

## 2023-04-25 MED ORDER — ONDANSETRON HCL 4 MG/2ML IJ SOLN: 4.0000 mg | Freq: Once | INTRAMUSCULAR | Status: DC

## 2023-04-25 NOTE — ED Provider Notes (Signed)
[facility] Provider Note   CSN: [id number] Arrival date & time: 04/25/23  0846     History  Chief Complaint  Patient presents with   Abdominal Pain    Maria Mccarty is a 52 y.o. female heard, hypertension presented with abdominal pain that began yesterday.  Patient states the pain is in the right lower quadrant and hurts with moving.  Patient states standing still feels better.  Patient states that she still has her appendix and gallbladder but denies any history of kidney stones.  Patient states she still to urinate and have bowel movements without issue.  Patient denies any fevers, chest pain, shortness of breath.  Patient does endorse nausea without emesis.  Patient states she has been able to eat and drink over the past day.  Patient states the pain stays in her right lower quadrant and does not radiate.  Patient denies any vaginal bleeding/discharge, new onset weakness, change in sensation/motor skills  Home Medications Prior to Admission medications   Medication Sig Start Date End Date Taking? Authorizing Provider  Cholecalciferol (VITAMIN D) 125 MCG (5000 UT) CAPS Take 1 capsule by mouth daily.    [provider]  cyanocobalamin (VITAMIN B12) 1000 MCG tablet Take 1 tablet (1,000 mcg total) by mouth daily. 04/04/23   Loyola Mast, MD  hydrOXYzine (ATARAX) 10 MG tablet Take 1 tablet (10 mg total) by mouth 3 (three) times daily as needed for anxiety. 12/06/22   Loyola Mast, MD  lisinopril (ZESTRIL) 40 MG tablet Take 1 tablet (40 mg total) by mouth daily. 12/06/22   Loyola Mast, MD  NEXIUM 40 MG capsule Take 1 capsule (40 mg total) by mouth daily as needed. 07/07/22   Christen Butter, NP  spironolactone (ALDACTONE) 25 MG tablet Take 1 tablet (25 mg total) by mouth daily. Patient not taking: Reported on 04/04/2023 02/14/23   Loyola Mast, MD      Allergies    Penicillins    Review of Systems   Review of Systems   Gastrointestinal:  Positive for abdominal pain.    Physical Exam Updated Vital Signs BP (!) 140/88   Pulse 69   Temp 97.8 F (36.6 C)   Resp 17 Comment: Simultaneous filing. User may not have seen previous data.  Ht 5\' 3"  (1.6 m)   Wt 63.5 kg   LMP 12/05/2017 (Exact Date)   SpO2 98%   BMI 24.80 kg/m  Physical Exam Vitals reviewed.  Constitutional:      General: She is not in acute distress. HENT:     Head: Normocephalic and atraumatic.  Eyes:     Extraocular Movements: Extraocular movements intact.     Conjunctiva/sclera: Conjunctivae normal.     Pupils: Pupils are equal, round, and reactive to light.  Cardiovascular:     Rate and Rhythm: Normal rate and regular rhythm.     Pulses: Normal pulses.     Heart sounds: Normal heart sounds.     Comments: 2+ bilateral radial/dorsalis pedis pulses with regular rate Pulmonary:     Effort: Pulmonary effort is normal. No respiratory distress.     Breath sounds: Normal breath sounds.  Abdominal:     Palpations: Abdomen is soft.     Tenderness: There is abdominal tenderness in the right upper quadrant. There is no guarding or rebound. Negative signs include Rovsing's sign, McBurney's sign and psoas sign.  Musculoskeletal:        General: Normal range  of motion.     Cervical back: Normal range of motion and neck supple.     Comments: 5 out of 5 bilateral grip/leg extension strength  Skin:    General: Skin is warm and dry.     Capillary Refill: Capillary refill takes less than 2 seconds.  Neurological:     General: No focal deficit present.     Mental Status: She is alert and oriented to person, place, and time.     Comments: Sensation intact in all 4 limbs  Psychiatric:        Mood and Affect: Mood normal.     ED Results / Procedures / Treatments   Labs (all labs ordered are listed, but only abnormal results are displayed) Labs Reviewed  URINALYSIS, ROUTINE W REFLEX MICROSCOPIC - Abnormal; Notable for the following  components:      Result Value   Hgb urine dipstick TRACE (*)    All other components within normal limits  URINALYSIS, MICROSCOPIC (REFLEX) - Abnormal; Notable for the following components:   Bacteria, UA RARE (*)    All other components within normal limits  CBC WITH DIFFERENTIAL/PLATELET  COMPREHENSIVE METABOLIC PANEL  LIPASE, BLOOD    EKG None  Radiology CT ABDOMEN PELVIS W CONTRAST  Result Date: 04/25/2023 CLINICAL DATA:  Right lower quadrant abdominal pain and nausea. EXAM: CT ABDOMEN AND PELVIS WITH CONTRAST TECHNIQUE: Multidetector CT imaging of the abdomen and pelvis was performed using the standard protocol following bolus administration of intravenous contrast. RADIATION DOSE REDUCTION: This exam was performed according to the departmental dose-optimization program which includes automated exposure control, adjustment of the mA and/or kV according to patient size and/or use of iterative reconstruction technique. CONTRAST:  OMNIPAQUE IOHEXOL 300 MG/ML  SOLN COMPARISON:  CT 02/23/2022.  Ultrasound earlier 04/25/2023 FINDINGS: Lower chest: There is some linear opacity at the right lung base likely scar or atelectasis. No pleural effusion. Hepatobiliary: No focal liver abnormality is seen. Mild fatty liver infiltration. No gallstones, gallbladder wall thickening, or biliary dilatation. Pancreas: Unremarkable. No pancreatic ductal dilatation or surrounding inflammatory changes. Spleen: Normal in size without focal abnormality. Adrenals/Urinary Tract: Right adrenal gland is preserved. Once again there is a tiny nodule medial along the left adrenal gland on series 2 image 15, unchanged from previous examination and again most consistent with a small adenoma. No specific imaging follow-up. No enhancing renal mass or collecting system dilatation. Preserved contours of the urinary bladder. Stomach/Bowel: Stomach and small bowel are nondilated. No oral contrast. The large bowel also has a  normal course and caliber. There is scattered stool greatest along the right side of the colon. Cecum resides in the anterior right hemipelvis. Normal appendix posterior to the cecum. Vascular/Lymphatic: Normal caliber aorta and IVC with scattered vascular calcifications. No specific abnormal lymph node enlargement identified in the abdomen and pelvis. Reproductive: Uterus and bilateral adnexa are unremarkable. Other: No abdominal wall hernia or abnormality. No abdominopelvic ascites. Musculoskeletal: No acute or significant osseous findings. IMPRESSION: No bowel obstruction, free air or free fluid. Diffuse colonic stool. Normal appendix. Mild fatty liver infiltration. Electronically Signed   By: Karen Kays M.D.   On: 04/25/2023 12:21   US Abdomen Limited RUQ (LIVER/GB)  Result Date: 04/25/2023 CLINICAL DATA:  Right upper quadrant abdominal pain.  Nausea. EXAM: ULTRASOUND ABDOMEN LIMITED RIGHT UPPER QUADRANT COMPARISON:  CT abdomen and pelvis-02/23/2022 FINDINGS: Gallbladder: Normal sonographic appearance of the gallbladder. No gallbladder wall thickening or pericholecystic fluid. No echogenic gallstones or biliary sludge. Negative  sonographic Murphy's sign. Common bile duct: Diameter: Normal in size measuring 2.6 mm in diameter Liver: No focal lesion identified. Within normal limits in parenchymal echogenicity. Portal vein is patent on color Doppler imaging with normal direction of blood flow towards the liver. Other: None. IMPRESSION: No explanation for patient's right upper quadrant abdominal pain. Specifically, no evidence of cholelithiasis/cholecystitis. No explanation Electronically Signed   By: Simonne Come M.D.   On: 04/25/2023 10:14    Procedures Ultrasound ED Peripheral IV (Provider)  Date/Time: 04/25/2023 10:48 AM  Performed by: Netta Corrigan, PA-C Authorized by: Netta Corrigan, PA-C   Procedure details:    Indications: multiple failed IV attempts     Skin Prep: chlorhexidine gluconate      Location:  Right AC   Angiocath:  20 G   Bedside Ultrasound Guided: Yes     Images: archived     Patient tolerated procedure without complications: Yes     Dressing applied: No       Medications Ordered in ED Medications  ondansetron (ZOFRAN) injection 4 mg (4 mg Intravenous Patient Refused/Not Given 04/25/23 1106)  iohexol (OMNIPAQUE) 300 MG/ML solution 100 mL (100 mLs Intravenous Contrast Given 04/25/23 1117)    ED Course/ Medical Decision Making/ A&P                             Medical Decision Making Amount and/or Complexity of Data Reviewed Labs: ordered. Radiology: ordered.  Risk Prescription drug management.   [facility] 52 y.o. presented today for abdominal pain. Working DDx that I considered at this time includes, but not limited to, gastroenteritis, colitis, small bowel obstruction, appendicitis, cholecystitis, pancreatitis, nephrolithiasis, AAA, UTI, pyelonephritis, ruptured ectopic pregnancy, PID, ovarian torsion.  R/o DDx: gastroenteritis, colitis, small bowel obstruction, appendicitis, cholecystitis, pancreatitis, nephrolithiasis, AAA, UTI, pyelonephritis, ruptured ectopic pregnancy, PID, ovarian torsion: These are considered less likely due to history of present illness and physical exam findings.  Review of prior external notes: 04/04/2023 office visit  Unique Tests and My Interpretation:  CBC with differential: Unremarkable CMP: Unremarkable Lipase: Unremarkable UA: Unremarkable EKG: Rate, rhythm, axis, intervals all examined and without medically relevant abnormality. ST segments without concerns for elevations CT Abd/Pelvis with contrast:  Pelvic US: No acute changes  Discussion with Independent Historian: None  Discussion of Management of Tests: None  Risk: Low: based on diagnostic testing/clinical impression and treatment plan  Risk Stratification Score: none  Plan: On exam patient was in no acute distress stable vitals standing  comfortably in the room.  On exam patient stated that she was having right lower quadrant tenderness however when I palpated patient was having right upper quadrant tenderness and no right lower quadrant tenderness.  Patient states she still has her gallbladder but denies any infectious symptoms.  The rest the patient's physical dam is unremarkable.  Patient states that she does not want to have a CT scan as she has had many in the past and she is concerned for radiation.  Patient states she wants to do labs and ultrasound and if those are negative then proceed with a CT scan.  I spoke to the patient about how this might delay care as a CT scan would give [place] more information and be more sensitive as patient was initially endorsing right lower quadrant tenderness but also had right upper quadrant tenderness on exam and patient verbalized that she understands that this may delay care and ultrasound has decreased  sensitivity but states that she understands the risks and accepts them.  Patient's vital signs Capacity so we will proceed with labs and right upper quadrant ultrasound.  Patient given Zofran for nausea.  Patient stable this time.  Patient's labs and ultrasound came back reassuring.  Patient spoke with the attending and changed her mind about getting a CT scan and so a CT abdomen pelvis with contrast was ordered.  I placed an ultrasound IV after the nurses were unable to get an IV.  Pending CT scan patient may be discharged with primary care follow-up.  Patient stable at this time.  CT came back reassuring.  At this time patient's vitals and presentation has been stable throughout ED stay.  At this time I have low suspicion for life-threatening diagnoses and patient will be discharged with primary care follow-up.  I encouraged patient use Tylenol every 6 hours needed for pain and to monitor symptoms and to return to ER if symptoms were to change or worsen.  Patient was given return precautions. Patient  stable for discharge at this time.  Patient verbalized understanding of plan.         Final Clinical Impression(s) / ED Diagnoses Final diagnoses:  Generalized abdominal pain    Rx / DC Orders ED Discharge Orders     None         Remi Deter 04/25/23 1248    Laurence Spates, MD 04/25/23 [phone]

## 2023-04-25 NOTE — Discharge Instructions (Signed)
Please follow-up with your primary care regarding her recent symptoms and ER visit.  Today your labs and imaging were all reassuring and your abdominal pain may be musculoskeletal in nature.  You may take Tylenol every 6 hours as needed for pain.  If symptoms change or worsen please return to ER.

## 2023-04-25 NOTE — ED Notes (Signed)
Reviewed discharge instructions with pt. States understanding

## 2023-04-25 NOTE — ED Notes (Signed)
IV and blood draw attempt x2, unsuccessful. Patient tolerated well.  RN will be notified.

## 2023-04-25 NOTE — ED Triage Notes (Signed)
Patient presents to ED via POV from home. Here with abdominal pain that began yesterday. Reports RLQ pain and nausea.

## 2023-04-26 ENCOUNTER — Telehealth: Payer: Self-pay

## 2023-04-26 NOTE — Transitions of Care (Post Inpatient/ED Visit) (Signed)
   04/26/2023  Name: Maria Mccarty MRN: [id number] DOB: [birth year]  Today's TOC FU Call Status: Today's TOC FU Call Status:: Successful TOC FU Call Competed TOC FU Call Complete Date: 04/26/23  Attempted to reach the patient regarding the most recent Inpatient/ED visit.  Follow Up Plan: Additional outreach attempts will be made to reach the patient to complete the Transitions of Care (Post Inpatient/ED visit) call.   Signature Arvil Persons, BSN, Charity fundraiser

## 2023-05-09 ENCOUNTER — Ambulatory Visit: Payer: Medicaid Other | Admitting: Family Medicine

## 2023-05-09 ENCOUNTER — Encounter: Payer: Self-pay | Admitting: Family Medicine

## 2023-05-09 VITALS — BP 130/70 | HR 79 | Temp 97.6°F | Ht 63.0 in | Wt 140.6 lb

## 2023-05-09 DIAGNOSIS — K219 Gastro-esophageal reflux disease without esophagitis: Secondary | ICD-10-CM

## 2023-05-09 DIAGNOSIS — K5904 Chronic idiopathic constipation: Secondary | ICD-10-CM

## 2023-05-09 MED ORDER — ESOMEPRAZOLE MAGNESIUM 20 MG PO CPDR
20.0000 mg | DELAYED_RELEASE_CAPSULE | Freq: Every day | ORAL | 3 refills | Status: DC
Start: 2023-05-09 — End: 2023-05-24

## 2023-05-09 NOTE — Patient Instructions (Signed)
Constipation: Our goal is to achieve formed bowel movements daily or every-other-day.  You may need to try different combinations of the following options to find what works best for you - everybody's body works differently so feel free to adjust the dosages as needed.  Some options to help maintain bowel health include:   Dietary changes (more leafy greens, vegetables and fruits; less processed foods) Fiber supplementation (Benefiber, FiberCon, Metamucil or Psyllium). Start slow and increase gradually to full dose. Over-the-counter agents such as: stool softeners (Docusate or Colace) and/or laxatives (Miralax, milk of magnesia)  "Power Pudding" is a natural mixture that may help your constipation.  To make blend 1 cup applesauce, 1 cup wheat bran, and 3/4 cup prune juice, refrigerate and then take 1 tablespoon daily with a large glass of water as needed.  Green Latte  2 cups of spinach 2 teaspoons of matcha (non-sweet)  1 and half cup of diary (almond milk normally) 1 banana Some chia seeds and honey  Blend well in a blender

## 2023-05-09 NOTE — Progress Notes (Signed)
[facility] [address] [place] [address] Dept: [phone] Dept Fax: [phone]  Office Visit  Subjective:    Patient ID: Maria Mccarty, female    DOB: 1971-01-15, 52 y.o..   MRN: [id number]  Chief Complaint  Patient presents with   Abdominal Pain    F/u abdominal pain (UC 04/25/23).  Still having some lower RT abdominal pain.     History of Present Illness:  Patient is in today for follow up from a recent Urgent care visit. She was seen on 7/22 at [facility] with sharp right-sided abdominal pain. She had been experiencing this for a few days at the time. Although her work-up was fairly benign, she has some questions regarding the CT exam. She was told the underlying issue was due to a build-up of stool. she has had this same finding previously on scans. She admits she only has a bowel movement about twice a week. She has tried to use Miralax, Dulcolax, and increase her dietary fiber intake. She also recently purchased some Metamucil gummies, but is not using yet. She had a colonoscopy 3 years ago and had 5 polyps removed.  Past Medical History: Patient Active Problem List   Diagnosis Date Noted   Meralgia paraesthetica, left 04/04/2023   Vitamin B12 deficiency 02/15/2023   Numbness and tingling of both legs 02/14/2023   Uterine fibroid 12/06/2022   Left sided sciatica 12/06/2022   Cervical spondylosis 10/05/2022   Hyponatremia 02/17/2022   Pharyngeal dysphagia 01/05/2022   Anxiety, generalized 09/11/2020   Essential hypertension 01/02/2020   Gastroesophageal reflux disease    Laryngopharyngeal reflux (LPR) 05/01/2018   Borderline hyperlipidemia 02/23/2018   Chronic idiopathic constipation 12/21/2017   S/P LEEP 12/21/2016   Past Surgical History:  Procedure Laterality Date   24 HOUR PH STUDY N/A 04/24/2018   Procedure: 24 HOUR PH STUDY;  Surgeon: Napoleon Form, MD;  Location: [facility];   Service: Endoscopy;  Laterality: N/A;   CERVICAL BIOPSY  W/ LOOP ELECTRODE EXCISION     COLPOSCOPY     ESOPHAGEAL MANOMETRY N/A 04/24/2018   Procedure: ESOPHAGEAL MANOMETRY (EM);  Surgeon: Napoleon Form, MD;  Location: [facility];  Service: Endoscopy;  Laterality: N/A;   LEEP     PH IMPEDANCE STUDY  04/24/2018   Procedure: PH IMPEDANCE STUDY;  Surgeon: Napoleon Form, MD;  Location: [facility];  Service: Endoscopy;;   TONSILLECTOMY     Family History  Problem Relation Age of Onset   Cancer Mother        Colon   Alcohol abuse Mother    Hypertension Mother    Peripheral Artery Disease Mother    Cancer Brother        Kidney   Heart disease Brother    Hypertension Brother    Hypertension Brother    Stroke Paternal Grandmother    Diabetes Paternal Grandfather    Outpatient Medications Prior to Visit  Medication Sig Dispense Refill   Cholecalciferol (VITAMIN D) 125 MCG (5000 UT) CAPS Take 1 capsule by mouth daily.     cyanocobalamin (VITAMIN B12) 1000 MCG tablet Take 1 tablet (1,000 mcg total) by mouth daily. 100 tablet 2   hydrOXYzine (ATARAX) 10 MG tablet Take 1 tablet (10 mg total) by mouth 3 (three) times daily as needed for anxiety. 90 tablet 1   lisinopril (ZESTRIL) 40 MG tablet Take 1 tablet (40 mg total) by mouth daily. 90 tablet 3   NEXIUM  40 MG capsule Take 1 capsule (40 mg total) by mouth daily as needed. 90 capsule 1   spironolactone (ALDACTONE) 25 MG tablet Take 1 tablet (25 mg total) by mouth daily. 90 tablet 3   No facility-administered medications prior to visit.   Allergies  Allergen Reactions   Penicillins Rash     Objective:   Today's Vitals   05/09/23 0759  BP: 130/70  Pulse: 79  Temp: 97.6 F (36.4 C)  TempSrc: Temporal  SpO2: 100%  Weight: 140 lb 9.6 oz (63.8 kg)  Height: 5\' 3"  (1.6 m)   Body mass index is 24.91 kg/m.   General: Well developed, well nourished. No acute distress. Psych: Alert and oriented. Normal mood and  affect.  Health Maintenance Due  Topic Date Due   HIV Screening  Never done   Hepatitis C Screening  Never done   Lab Results    Latest Ref Rng & Units 04/25/2023    9:57 AM 02/16/2022   12:00 AM 01/05/2022   12:00 AM  CBC  WBC 4.0 - 10.5 K/uL 6.1  5.2  4.4   Hemoglobin 12.0 - 15.0 g/dL 21.3  08.6  57.8   Hematocrit 36.0 - 46.0 % 42.0  41.5  40.9   Platelets 150 - 400 K/uL 206  302  240       Latest Ref Rng & Units 04/25/2023    9:57 AM 04/04/2023    9:59 AM 02/14/2023    1:59 PM  CMP  Glucose 70 - 99 mg/dL 97  88  90   BUN 6 - 20 mg/dL 9  10  8    Creatinine 0.44 - 1.00 mg/dL 4.69  6.29  5.28   Sodium 135 - 145 mmol/L 137  137  140   Potassium 3.5 - 5.1 mmol/L 3.9  4.4  4.5   Chloride 98 - 111 mmol/L 102  101  104   CO2 22 - 32 mmol/L 26  28  28    Calcium 8.9 - 10.3 mg/dL 9.1  9.5  9.6   Total Protein 6.5 - 8.1 g/dL 7.2   6.9   Total Bilirubin 0.3 - 1.2 mg/dL 0.6   0.5   Alkaline Phos 38 - 126 U/L 67   73   AST 15 - 41 U/L 22   16   ALT 0 - 44 U/L 23   12    Urinalysis    Component Value Date/Time   COLORURINE YELLOW 04/25/2023 0854   APPEARANCEUR CLEAR 04/25/2023 0854   LABSPEC 1.010 04/25/2023 0854   PHURINE 7.0 04/25/2023 0854   GLUCOSEU NEGATIVE 04/25/2023 0854   HGBUR TRACE (A) 04/25/2023 0854   BILIRUBINUR NEGATIVE 04/25/2023 0854   BILIRUBINUR Negative 01/27/2023 0958   KETONESUR NEGATIVE 04/25/2023 0854   PROTEINUR NEGATIVE 04/25/2023 0854   UROBILINOGEN 0.2 01/27/2023 0958   NITRITE NEGATIVE 04/25/2023 0854   LEUKOCYTESUR NEGATIVE 04/25/2023 0854   Lab Results  Component Value Date   LIPASE 34 04/25/2023   Imaging: RUQ ultrasound (04/25/2023) IMPRESSION: No explanation for patient's right upper quadrant abdominal pain. Specifically, no evidence of cholelithiasis/cholecystitis.   CT of Abdomen and Pelvis w contrast (04/25/2023) IMPRESSION: No bowel obstruction, free air or free fluid. Diffuse colonic stool. Normal appendix.   Mild fatty liver  infiltration.    Assessment & Plan:   Problem List Items Addressed This Visit       Digestive   Chronic idiopathic constipation - Primary    Based on the review of  her testing and imaging, it is most likely that her pain was from her previously identified issues with chronic constipation. I reassured her about other incidental findings on her scans. This may represent IBS-C, as the underlying issue. We discussed various components of helping maintain regular bowel movements, including fiber and use of Miralax.      Gastroesophageal reflux disease    Stable. She would prefer to use the 20 mg dose of esomeprazole. I will fill this today.      Relevant Medications   esomeprazole (NEXIUM) 20 MG capsule    Return if symptoms worsen or fail to improve.   Loyola Mast, MD

## 2023-05-09 NOTE — Assessment & Plan Note (Signed)
Based on the review of her testing and imaging, it is most likely that her pain was from her previously identified issues with chronic constipation. I reassured her about other incidental findings on her scans. This may represent IBS-C, as the underlying issue. We discussed various components of helping maintain regular bowel movements, including fiber and use of Miralax.

## 2023-05-09 NOTE — Assessment & Plan Note (Signed)
Stable. She would prefer to use the 20 mg dose of esomeprazole. I will fill this today.

## 2023-05-13 DIAGNOSIS — R0981 Nasal congestion: Secondary | ICD-10-CM | POA: Diagnosis not present

## 2023-05-13 DIAGNOSIS — R051 Acute cough: Secondary | ICD-10-CM | POA: Diagnosis not present

## 2023-05-13 DIAGNOSIS — H9203 Otalgia, bilateral: Secondary | ICD-10-CM | POA: Diagnosis not present

## 2023-05-17 ENCOUNTER — Telehealth: Payer: Self-pay | Admitting: Family Medicine

## 2023-05-17 NOTE — Telephone Encounter (Signed)
Noted was advised to go to ER by triage.  Dm/cma

## 2023-05-17 NOTE — Telephone Encounter (Signed)
Pt called in stating I'm having trouble breathing. She said I can't seem to get enough air in my lungs and I have a croupy cough. She is unable to cough up any mucous. I transferred her over to nurse triage

## 2023-05-18 DIAGNOSIS — D3132 Benign neoplasm of left choroid: Secondary | ICD-10-CM | POA: Diagnosis not present

## 2023-05-23 ENCOUNTER — Ambulatory Visit: Payer: Medicaid Other | Admitting: Diagnostic Neuroimaging

## 2023-05-23 ENCOUNTER — Encounter: Payer: Self-pay | Admitting: Diagnostic Neuroimaging

## 2023-05-23 VITALS — BP 152/92 | HR 65 | Ht 63.0 in | Wt 140.0 lb

## 2023-05-23 DIAGNOSIS — M79605 Pain in left leg: Secondary | ICD-10-CM | POA: Diagnosis not present

## 2023-05-23 NOTE — Patient Instructions (Signed)
  LEFT LEG PAIN / NUMBNESS (since Oct 2023; muscle strain while pushing wheelchair for mother; slowly improving over time) - noted some slight improvement with PT; recommend to continue

## 2023-05-23 NOTE — Progress Notes (Signed)
[facility]  PATIENT: Maria Mccarty DOB: 09-Mar-1971  REFERRING CLINICIAN: Loyola Mast, MD HISTORY FROM: patient  REASON FOR VISIT: new consult   HISTORICAL  CHIEF COMPLAINT:  Chief Complaint  Patient presents with   New Patient (Initial Visit)    Pt alone, rm 10 Jul 2022. Was pushing her mom in a wheelchair and when she went to turn she felt like something torn on left leg from knee up into thigh and gluteal. She saw a sports med MD. The completed xrays and MRI of lumbar spine. April/may 2024 completed PT. It has subsided in the last couple months as far as severity. Its more intermittent. There is still decreased sensation on left thigh and when touching it causes a tingling sensation into the foot.     HISTORY OF PRESENT ILLNESS:   52 year old female here for evaluation of left thigh pain and numbness.  October 2023 patient was pushing her mother in a wheelchair when she turned and twisted, had sudden tearing pain in her left lateral and posterior thigh region.  Symptoms were quite severe for several months.  She saw sports medicine had variety of imaging and treatments, but no major issues were found on testing.  Symptoms have gradually improved over time.  No issues on the right leg.  No issues with upper extremities.  No low back pain.   REVIEW OF SYSTEMS: Full 14 system review of systems performed and negative with exception of: as per HPI.  ALLERGIES: Allergies  Allergen Reactions   Penicillins Rash    HOME MEDICATIONS: Outpatient Medications Prior to Visit  Medication Sig Dispense Refill   Cholecalciferol (VITAMIN D) 125 MCG (5000 UT) CAPS Take 1 capsule by mouth daily.     cyanocobalamin (VITAMIN B12) 1000 MCG tablet Take 1 tablet (1,000 mcg total) by mouth daily. 100 tablet 2   esomeprazole (NEXIUM) 20 MG capsule Take 1 capsule (20 mg total) by mouth daily at 12 noon. 90 capsule 3   hydrOXYzine (ATARAX) 10 MG tablet Take 1 tablet (10  mg total) by mouth 3 (three) times daily as needed for anxiety. 90 tablet 1   lisinopril (ZESTRIL) 40 MG tablet Take 1 tablet (40 mg total) by mouth daily. 90 tablet 3   spironolactone (ALDACTONE) 25 MG tablet Take 1 tablet (25 mg total) by mouth daily. 90 tablet 3   NEXIUM 40 MG capsule Take 1 capsule (40 mg total) by mouth daily as needed. 90 capsule 1   No facility-administered medications prior to visit.    PAST MEDICAL HISTORY: Past Medical History:  Diagnosis Date   Acid reflux    Hypertension    Vaginal Pap smear, abnormal     PAST SURGICAL HISTORY: Past Surgical History:  Procedure Laterality Date   58 HOUR PH STUDY N/A 04/24/2018   Procedure: 24 HOUR PH STUDY;  Surgeon: Napoleon Form, MD;  Location: [facility];  Service: Endoscopy;  Laterality: N/A;   CERVICAL BIOPSY  W/ LOOP ELECTRODE EXCISION     COLPOSCOPY     ESOPHAGEAL MANOMETRY N/A 04/24/2018   Procedure: ESOPHAGEAL MANOMETRY (EM);  Surgeon: Napoleon Form, MD;  Location: [facility];  Service: Endoscopy;  Laterality: N/A;   LEEP     PH IMPEDANCE STUDY  04/24/2018   Procedure: PH IMPEDANCE STUDY;  Surgeon: Napoleon Form, MD;  Location: [facility];  Service: Endoscopy;;   TONSILLECTOMY      FAMILY HISTORY: Family History  Problem Relation Age of Onset  Cancer Mother        Colon   Alcohol abuse Mother    Hypertension Mother    Peripheral Artery Disease Mother    Cancer Brother        Kidney   Heart disease Brother    Hypertension Brother    Hypertension Brother    Stroke Paternal Grandmother    Diabetes Paternal Grandfather     SOCIAL HISTORY: Social History   Socioeconomic History   Marital status: Single    Spouse name: Not on file   Number of children: 3   Years of education: Not on file   Highest education level: Not on file  Occupational History   Occupation: Unemployed  Tobacco Use   Smoking status: Former    Current packs/day: 0.00    Types: Cigarettes    Quit  date: 03/04/2014    Years since quitting: 9.2   Smokeless tobacco: Never  Vaping Use   Vaping status: Never Used  Substance and Sexual Activity   Alcohol use: No   Drug use: No   Sexual activity: Not Currently    Birth control/protection: None  Other Topics Concern   Not on file  Social History Narrative   Not on file   Social Determinants of Health   Financial Resource Strain: Low Risk  (04/04/2023)   Overall Financial Resource Strain (CARDIA)    Difficulty of Paying Living Expenses: Not hard at all  Food Insecurity: No Food Insecurity (04/04/2023)   Hunger Vital Sign    Worried About Running Out of Food in the Last Year: Never true    Ran Out of Food in the Last Year: Never true  Transportation Needs: No Transportation Needs (04/04/2023)   PRAPARE - Administrator, Civil Service (Medical): No    Lack of Transportation (Non-Medical): No  Physical Activity: Sufficiently Active (04/04/2023)   Exercise Vital Sign    Days of Exercise per Week: 4 days    Minutes of Exercise per Session: 40 min  Stress: No Stress Concern Present (04/04/2023)   [employer] of Occupational Health - Occupational Stress Questionnaire    Feeling of Stress : Not at all  Social Connections: Unknown (04/04/2023)   Social Connection and Isolation Panel [NHANES]    Frequency of Communication with Friends and Family: More than three times a week    Frequency of Social Gatherings with Friends and Family: More than three times a week    Attends Religious Services: More than 4 times per year    Active Member of [employer] or Organizations: Yes    Attends Banker Meetings: More than 4 times per year    Marital Status: Patient declined  Intimate Partner Violence: Unknown (01/05/2022)   Received from [facility]   HITS    Physically Hurt: Not on file    Insult or Talk Down To: Not on file    Threaten Physical Harm: Not on file    Scream or Curse: Not on file     PHYSICAL EXAM  GENERAL  EXAM/CONSTITUTIONAL: Vitals:  Vitals:   05/23/23 0910  BP: (!) 152/92  Pulse: 65  Weight: 140 lb (63.5 kg)  Height: 5\' 3"  (1.6 m)   Body mass index is 24.8 kg/m. Wt Readings from Last 3 Encounters:  05/23/23 140 lb (63.5 kg)  05/09/23 140 lb 9.6 oz (63.8 kg)  04/25/23 140 lb (63.5 kg)   Patient is in no distress; well developed, nourished and groomed; neck is supple  CARDIOVASCULAR: Examination of carotid arteries is normal; no carotid bruits Regular rate and rhythm, no murmurs Examination of peripheral vascular system by observation and palpation is normal  EYES: Ophthalmoscopic exam of optic discs and posterior segments is normal; no papilledema or hemorrhages No results found.  MUSCULOSKELETAL: Gait, strength, tone, movements noted in Neurologic exam below  NEUROLOGIC: MENTAL STATUS:      No data to display         awake, alert, oriented to person, place and time recent and remote memory intact normal attention and concentration language fluent, comprehension intact, naming intact fund of knowledge appropriate  CRANIAL NERVE:  2nd - no papilledema on fundoscopic exam 2nd, 3rd, 4th, 6th - pupils equal and reactive to light, visual fields full to confrontation, extraocular muscles intact, no nystagmus 5th - facial sensation symmetric 7th - facial strength symmetric 8th - hearing intact 9th - palate elevates symmetrically, uvula midline 11th - shoulder shrug symmetric 12th - tongue protrusion midline  MOTOR:  normal bulk and tone, full strength in the BUE, BLE  SENSORY:  normal and symmetric to light touch, temperature, vibration  COORDINATION:  finger-nose-finger, fine finger movements normal  REFLEXES:  deep tendon reflexes trace and symmetric  GAIT/STATION:  narrow based gait     DIAGNOSTIC DATA (LABS, IMAGING, TESTING) - I reviewed patient records, labs, notes, testing and imaging myself where available.  Lab Results  Component Value  Date   WBC 6.1 04/25/2023   HGB 14.1 04/25/2023   HCT 42.0 04/25/2023   MCV 88.4 04/25/2023   PLT 206 04/25/2023      Component Value Date/Time   NA 137 04/25/2023 0957   K 3.9 04/25/2023 0957   CL 102 04/25/2023 0957   CO2 26 04/25/2023 0957   GLUCOSE 97 04/25/2023 0957   GLUCOSE 94 11/13/2015 1017   BUN 9 04/25/2023 0957   CREATININE 0.73 04/25/2023 0957   CREATININE 0.55 11/02/2022 1439   CALCIUM 9.1 04/25/2023 0957   PROT 7.2 04/25/2023 0957   ALBUMIN 4.3 04/25/2023 0957   AST 22 04/25/2023 0957   ALT 23 04/25/2023 0957   ALKPHOS 67 04/25/2023 0957   BILITOT 0.6 04/25/2023 0957   GFRNONAA >60 04/25/2023 0957   GFRNONAA 103 03/25/2021 0000   GFRAA 120 03/25/2021 0000   Lab Results  Component Value Date   CHOL 216 (H) 12/21/2022   HDL 81.90 12/21/2022   LDLCALC 125 (H) 12/21/2022   TRIG 48.0 12/21/2022   CHOLHDL 3 12/21/2022   Lab Results  Component Value Date   HGBA1C 5.3 12/09/2017   Vitamin B-12  Date Value Ref Range Status  04/04/2023 493 211 - 911 pg/mL Final  02/14/2023 148 (L) 211 - 911 pg/mL Final   Lab Results  Component Value Date   TSH 4.23 02/14/2023    10/19/22 MRI lumbar spine [I reviewed images myself and agree with interpretation. -VRP] 1. No significant lumbar spine disc protrusion, foraminal stenosis or central canal stenosis. 2. No acute osseous injury of the lumbar spine.   ASSESSMENT AND PLAN  52 y.o. year old female here with:  Dx:  1. Left leg pain     PLAN:  LEFT LEG PAIN / NUMBNESS (since Oct 2023; muscle strain while pushing wheelchair for mother; slowly improving over time) - noted some slight improvement with PT; recommend to continue  Return for return to referring provider, pending if symptoms worsen or fail to improve.    Suanne Marker, MD 05/23/2023, 9:50 AM Certified in  Neurology, Neurophysiology and Neuroimaging  [facility] [address], [address] [place], [place] [address] [phone]

## 2023-05-24 ENCOUNTER — Encounter: Payer: Self-pay | Admitting: Family Medicine

## 2023-05-24 DIAGNOSIS — K219 Gastro-esophageal reflux disease without esophagitis: Secondary | ICD-10-CM

## 2023-05-24 DIAGNOSIS — F411 Generalized anxiety disorder: Secondary | ICD-10-CM

## 2023-05-24 MED ORDER — NEXIUM 20 MG PO CPDR
20.0000 mg | DELAYED_RELEASE_CAPSULE | Freq: Every day | ORAL | 11 refills | Status: DC
Start: 2023-05-24 — End: 2023-07-15

## 2023-05-27 ENCOUNTER — Other Ambulatory Visit (HOSPITAL_COMMUNITY): Payer: Self-pay

## 2023-05-30 ENCOUNTER — Telehealth: Payer: Self-pay

## 2023-05-30 NOTE — Telephone Encounter (Signed)
Pharmacy Patient Advocate Encounter   Received notification from Patient Advice Request messages that prior authorization for NEXIUM 20 MG delayed-release capsules is required/requested.   Insurance verification completed.   The patient is insured through [employer] .   Per test claim: PA required; PA submitted to [facility] via CoverMyMeds Key/confirmation #/EOC [id number] Status is pending

## 2023-05-31 ENCOUNTER — Other Ambulatory Visit (HOSPITAL_COMMUNITY): Payer: Self-pay

## 2023-05-31 NOTE — Telephone Encounter (Signed)
Pharmacy Patient Advocate Encounter  Received notification from [facility] that Prior Authorization for NEXIUM 20 MG delayed-release capsules has been APPROVED from 05/30/23 to 05/29/24   PA #/Case ID/Reference #: [id number]

## 2023-06-02 ENCOUNTER — Telehealth: Payer: Self-pay | Admitting: Family Medicine

## 2023-06-02 NOTE — Telephone Encounter (Signed)
Please call the pt . Pt said the pharmacy said they are waiting on an authorization for NEXIUM 20 MG capsule [[id number]]

## 2023-06-02 NOTE — Telephone Encounter (Signed)
Patient notified via phone that it is approved as 05/30/23.  She will call the pharmacy and if any issues will call me back. Dm/cma

## 2023-06-09 NOTE — Telephone Encounter (Signed)
Pharmacy was notified. Dm/cma

## 2023-07-12 ENCOUNTER — Telehealth: Payer: Self-pay | Admitting: Family Medicine

## 2023-07-12 NOTE — Telephone Encounter (Signed)
07/12/23 - Pt walked in saying her bp has been between 160/90-100, and was hoping to see a nurse today. I checked with Porfirio Mylar and she advised she goes to urgent care or ED has we are fully booked.

## 2023-07-13 NOTE — Telephone Encounter (Signed)
Left VM to rtn call to see if she went to urgent care or emergency room?

## 2023-07-14 ENCOUNTER — Telehealth: Payer: Self-pay | Admitting: Family Medicine

## 2023-07-14 NOTE — Telephone Encounter (Signed)
Spoke to patient advised that an in person appointment would be best.  Scheduled for 3:20 tomorrow. Dm/cma

## 2023-07-14 NOTE — Telephone Encounter (Signed)
Left VM to rtn call. Dm/cma       

## 2023-07-14 NOTE — Telephone Encounter (Signed)
See other message.  Dm/cma  

## 2023-07-14 NOTE — Telephone Encounter (Signed)
Pt called and said she is returning your call about her blood pressure. Please call her again

## 2023-07-15 ENCOUNTER — Encounter: Payer: Self-pay | Admitting: Family Medicine

## 2023-07-15 ENCOUNTER — Ambulatory Visit: Payer: Medicaid Other | Admitting: Family Medicine

## 2023-07-15 VITALS — BP 162/90 | HR 80 | Temp 98.0°F | Ht 63.0 in | Wt 142.4 lb

## 2023-07-15 DIAGNOSIS — K219 Gastro-esophageal reflux disease without esophagitis: Secondary | ICD-10-CM | POA: Diagnosis not present

## 2023-07-15 DIAGNOSIS — D3502 Benign neoplasm of left adrenal gland: Secondary | ICD-10-CM

## 2023-07-15 DIAGNOSIS — F411 Generalized anxiety disorder: Secondary | ICD-10-CM

## 2023-07-15 DIAGNOSIS — I10 Essential (primary) hypertension: Secondary | ICD-10-CM

## 2023-07-15 MED ORDER — SPIRONOLACTONE 25 MG PO TABS: 25.0000 mg | ORAL_TABLET | Freq: Every day | ORAL | 3 refills | Status: DC

## 2023-07-15 MED ORDER — ESOMEPRAZOLE MAGNESIUM 40 MG PO CPDR: 40.0000 mg | DELAYED_RELEASE_CAPSULE | Freq: Every day | ORAL | 3 refills | Status: DC

## 2023-07-15 NOTE — Assessment & Plan Note (Signed)
Symptoms are increased. I will try increasing her dose of esomeprazole to 40 mg daily.

## 2023-07-15 NOTE — Progress Notes (Signed)
[facility] [address] [place] [address] Dept: [phone] Dept Fax: [phone]  Office Visit  Subjective:    Patient ID: Maria Mccarty, female    DOB: 10/29/1970, 52 y.o..   MRN: [id number]  Chief Complaint  Patient presents with   Hypertension    C/o having elevated BP.    History of Present Illness:  Patient is in today having noted recent issues with her BP being quite elevated. Maria Mccarty has a history of hypertension. She is currently managed on lisinopril 40 mg daily. I had started her on spironolactone in May, but she apparently is no longer taking this. She was previously on HCTZ, but had developed chronic hyponatremia. I tried prescribing amlodipine, but she developed ankle swelling and constipation. She has been monitoring her BP at home, getting values similar to what we saw in the office today. She wonders if her anxiety is part of this. She feels some increased stress, particularly around health issues. She worries about the adrenal adenoma seen on abdominal scans over the past year.  Maria Mccarty has a history of GERD. She is managed on esomeprazole. She notes GI had switched her to pantoprazole 40 mg, but she developed an intense burning sensation in her hands and feet with this. She switched back to her esomeprazole, but doesn't feel the 20 mg strength is helping enough.  Past Medical History: Patient Active Problem List   Diagnosis Date Noted   Adrenal adenoma, left 07/15/2023   Meralgia paraesthetica, left 04/04/2023   Vitamin B12 deficiency 02/15/2023   Numbness and tingling of both legs 02/14/2023   Uterine fibroid 12/06/2022   Left sided sciatica 12/06/2022   Cervical spondylosis 10/05/2022   Pharyngeal dysphagia 01/05/2022   Anxiety, generalized 09/11/2020   Essential hypertension 01/02/2020   Gastroesophageal reflux disease    Laryngopharyngeal reflux (LPR) 05/01/2018   Borderline  hyperlipidemia 02/23/2018   Chronic idiopathic constipation 12/21/2017   S/P LEEP 12/21/2016   Past Surgical History:  Procedure Laterality Date   24 HOUR PH STUDY N/A 04/24/2018   Procedure: 24 HOUR PH STUDY;  Surgeon: Maria Form, MD;  Location: [facility];  Service: Endoscopy;  Laterality: N/A;   CERVICAL BIOPSY  W/ LOOP ELECTRODE EXCISION     COLPOSCOPY     ESOPHAGEAL MANOMETRY N/A 04/24/2018   Procedure: ESOPHAGEAL MANOMETRY (EM);  Surgeon: Maria Form, MD;  Location: [facility];  Service: Endoscopy;  Laterality: N/A;   LEEP     PH IMPEDANCE STUDY  04/24/2018   Procedure: PH IMPEDANCE STUDY;  Surgeon: Maria Form, MD;  Location: [facility];  Service: Endoscopy;;   TONSILLECTOMY     Family History  Problem Relation Age of Onset   Cancer Mother        Colon   Alcohol abuse Mother    Hypertension Mother    Peripheral Artery Disease Mother    Cancer Brother        Kidney   Heart disease Brother    Hypertension Brother    Hypertension Brother    Stroke Paternal Grandmother    Diabetes Paternal Grandfather    Outpatient Medications Prior to Visit  Medication Sig Dispense Refill   cyanocobalamin (VITAMIN B12) 1000 MCG tablet Take 1 tablet (1,000 mcg total) by mouth daily. 100 tablet 2   hydrOXYzine (ATARAX) 10 MG tablet Take 1 tablet (10 mg total) by mouth 3 (three) times daily as needed for anxiety. 90 tablet 1  lisinopril (ZESTRIL) 40 MG tablet Take 1 tablet (40 mg total) by mouth daily. 90 tablet 3   NEXIUM 20 MG capsule Take 1 capsule (20 mg total) by mouth daily at 12 noon. 30 capsule 11   spironolactone (ALDACTONE) 25 MG tablet Take 1 tablet (25 mg total) by mouth daily. 90 tablet 3   Cholecalciferol (VITAMIN D) 125 MCG (5000 UT) CAPS Take 1 capsule by mouth daily. (Patient not taking: Reported on 07/15/2023)     No facility-administered medications prior to visit.   Allergies  Allergen Reactions   Penicillins Rash     Objective:    Today's Vitals   07/15/23 1502  BP: (!) 162/90  Pulse: 80  Temp: 98 F (36.7 C)  TempSrc: Temporal  SpO2: 97%  Weight: 142 lb 6.4 oz (64.6 kg)  Height: 5\' 3"  (1.6 m)   Body mass index is 25.23 kg/m.   General: Well developed, well nourished. No acute distress. Psych: Alert and oriented. Normal mood and affect.  Health Maintenance Due  Topic Date Due   HIV Screening  Never done   Hepatitis C Screening  Never done     Assessment & Plan:   Problem List Items Addressed This Visit       Cardiovascular and Mediastinum   Essential hypertension - Primary    BP is quite high today. Continue lisinopril 40 mg daily. I recommend she restart her spironolactone, as her BP was in better control when she was on this. I will recheck her electrolytes and renal function today.      Relevant Medications   spironolactone (ALDACTONE) 25 MG tablet   Other Relevant Orders   Basic metabolic panel   Aldosterone + renin activity w/ ratio     Digestive   Gastroesophageal reflux disease    Symptoms are increased. I will try increasing her dose of esomeprazole to 40 mg daily.      Relevant Medications   esomeprazole (NEXIUM) 40 MG capsule     Endocrine   Adrenal adenoma, left    [name] is concerned about whether this adenoma is impacting her BP. I do think it reasonable to check an aldosterone and renin activity to assess.      Relevant Orders   Aldosterone + renin activity w/ ratio     Other   Anxiety, generalized    Continue hydroxyzine.       Return in about 2 weeks (around 07/29/2023) for Reassessment.   Maria Mast, MD

## 2023-07-15 NOTE — Assessment & Plan Note (Signed)
BP is quite high today. Continue lisinopril 40 mg daily. I recommend she restart her spironolactone, as her BP was in better control when she was on this. I will recheck her electrolytes and renal function today.

## 2023-07-15 NOTE — Assessment & Plan Note (Signed)
Continue hydroxyzine.

## 2023-07-15 NOTE — Assessment & Plan Note (Signed)
Maria Mccarty is concerned about whether this adenoma is impacting her BP. I do think it reasonable to check an aldosterone and renin activity to assess.

## 2023-07-18 ENCOUNTER — Encounter: Payer: Self-pay | Admitting: Family Medicine

## 2023-07-21 LAB — BASIC METABOLIC PANEL
BUN/Creatinine Ratio: 16 (ref 9–23)
BUN: 11 mg/dL (ref 6–24)
CO2: 25 mmol/L (ref 20–29)
Calcium: 9.6 mg/dL (ref 8.7–10.2)
Chloride: 90 mmol/L — ABNORMAL LOW (ref 96–106)
Creatinine, Ser: 0.7 mg/dL (ref 0.57–1.00)
Glucose: 94 mg/dL (ref 70–99)
Potassium: 4 mmol/L (ref 3.5–5.2)
Sodium: 132 mmol/L — ABNORMAL LOW (ref 134–144)
eGFR: 103 mL/min/{1.73_m2} (ref >59)

## 2023-07-21 LAB — ALDOSTERONE + RENIN ACTIVITY W/ RATIO
Aldos/Renin Ratio: 1.1 (ref 0.0–30.0)
Aldosterone: 2.5 ng/dL (ref 0.0–30.0)
Renin Activity, Plasma: 2.361 ng/mL/h (ref 0.167–5.380)

## 2023-07-29 ENCOUNTER — Ambulatory Visit: Payer: Medicaid Other | Admitting: Family Medicine

## 2023-07-29 ENCOUNTER — Encounter: Payer: Self-pay | Admitting: Family Medicine

## 2023-07-29 VITALS — BP 136/70 | HR 69 | Temp 97.9°F | Ht 63.0 in | Wt 143.4 lb

## 2023-07-29 DIAGNOSIS — E538 Deficiency of other specified B group vitamins: Secondary | ICD-10-CM | POA: Diagnosis not present

## 2023-07-29 DIAGNOSIS — I10 Essential (primary) hypertension: Secondary | ICD-10-CM | POA: Diagnosis not present

## 2023-07-29 DIAGNOSIS — K219 Gastro-esophageal reflux disease without esophagitis: Secondary | ICD-10-CM | POA: Diagnosis not present

## 2023-07-29 DIAGNOSIS — E871 Hypo-osmolality and hyponatremia: Secondary | ICD-10-CM

## 2023-07-29 MED ORDER — NEXIUM 40 MG PO CPDR
40.0000 mg | DELAYED_RELEASE_CAPSULE | Freq: Every day | ORAL | 11 refills | Status: AC
Start: 2023-07-29 — End: ?

## 2023-07-29 MED ORDER — SUCRALFATE 1 GM/10ML PO SUSP: 1.0000 g | Freq: Three times a day (TID) | ORAL | 0 refills | Status: DC

## 2023-07-29 NOTE — Assessment & Plan Note (Signed)
The last BMP showed the hyponatremia had recurred despite her being off of thiazide diuretics at that point. I will plan to check a urine sodium and urine osmolarity at her next visit.

## 2023-07-29 NOTE — Assessment & Plan Note (Signed)
Symptoms are increased. I will continue her on Nexium (esomeprazole) 40 mg daily and see if we can get her pharmacy to provide her with the name brand product. I will add a 52-month course of sucralfate to see if this allows improved healing. We did discuss potential use of aloe vera juice. She felt the slimy quality of this might not be palatable for her.

## 2023-07-29 NOTE — Progress Notes (Signed)
[facility] [address] [place] [address] Dept: [phone] Dept Fax: [phone]  Chronic Care Office Visit  Subjective:    Patient ID: Maria Mccarty, female    DOB: 11/15/1970, 52 y.o..   MRN: [id number]  Chief Complaint  Patient presents with   Hypertension    2 week f/u HTN.     History of Present Illness:  Patient is in today for reassessment of chronic medical issues.  Patient is in for reassessment of her blood pressure. Ms. Coate has a history of hypertension. She has had some recent BPs over 160/90. At her last visit, I added spironolactone 25 mg to her lisinopril 40 mg daily. She was previously on HCTZ, but had developed chronic hyponatremia. This improved after stopping the HCTZ.  I tried prescribing amlodipine, but she developed ankle swelling and constipation. Anxiety may be part of what is triggering this. She notes her brother is managed on losartan and well controlled with this. She is considering starting to take a garlic extract supplement, as her father took this for many years and had no troubles with high blood pressure, heart disease, or high cholesterol.  Ms. Lippard has a history of GERD, pharyngeal dysphagia, and laryngopharyngeal reflux> She has been prescribed esomeprazole 40 mg daily. She has been upset at her pharmacy for providing her with a generic rather than name brand Nexium. She is convinced the name brand product works better. She does responding well to sucralfate about 4 years ago. She has an appointment pending with GI in early December.  Ms. Tongate has a history of a Vitamin B12 deficiency. Last spring, she went through a series of B12 injections. She is now on oral B12.  Past Medical History: Patient Active Problem List   Diagnosis Date Noted   Adrenal adenoma, left 07/15/2023   Meralgia paraesthetica, left 04/04/2023   Vitamin B12 deficiency 02/15/2023   Numbness and tingling  of both legs 02/14/2023   Uterine fibroid 12/06/2022   Left sided sciatica 12/06/2022   Cervical spondylosis 10/05/2022   Hyponatremia 02/17/2022   Pharyngeal dysphagia 01/05/2022   Anxiety, generalized 09/11/2020   Essential hypertension 01/02/2020   Gastroesophageal reflux disease    Laryngopharyngeal reflux (LPR) 05/01/2018   Borderline hyperlipidemia 02/23/2018   Chronic idiopathic constipation 12/21/2017   S/P LEEP 12/21/2016   Past Surgical History:  Procedure Laterality Date   24 HOUR PH STUDY N/A 04/24/2018   Procedure: 24 HOUR PH STUDY;  Surgeon: Napoleon Form, MD;  [facility]: [facility];  Service: Endoscopy;  Laterality: N/A;   CERVICAL BIOPSY  W/ LOOP ELECTRODE EXCISION     COLPOSCOPY     ESOPHAGEAL MANOMETRY N/A 04/24/2018   Procedure: ESOPHAGEAL MANOMETRY (EM);  Surgeon: Napoleon Form, MD;  [facility]: [facility];  Service: Endoscopy;  Laterality: N/A;   LEEP     PH IMPEDANCE STUDY  04/24/2018   Procedure: PH IMPEDANCE STUDY;  Surgeon: Napoleon Form, MD;  [facility]: [facility];  Service: Endoscopy;;   TONSILLECTOMY     Family History  Problem Relation Age of Onset   Cancer Mother        Colon   Alcohol abuse Mother    Hypertension Mother    Peripheral Artery Disease Mother    Cancer Brother        Kidney   Heart disease Brother    Hypertension Brother    Hypertension Brother    Stroke Paternal Grandmother    Diabetes Paternal  Grandfather    Outpatient Medications Prior to Visit  Medication Sig Dispense Refill   Cholecalciferol (VITAMIN D) 125 MCG (5000 UT) CAPS Take 1 capsule by mouth daily.     cyanocobalamin (VITAMIN B12) 1000 MCG tablet Take 1 tablet (1,000 mcg total) by mouth daily. 100 tablet 2   hydrOXYzine (ATARAX) 10 MG tablet Take 1 tablet (10 mg total) by mouth 3 (three) times daily as needed for anxiety. 90 tablet 1   lisinopril (ZESTRIL) 40 MG tablet Take 1 tablet (40 mg total) by mouth daily. 90 tablet 3   spironolactone  (ALDACTONE) 25 MG tablet Take 1 tablet (25 mg total) by mouth daily. 90 tablet 3   esomeprazole (NEXIUM) 40 MG capsule Take 1 capsule (40 mg total) by mouth daily. 30 capsule 3   No facility-administered medications prior to visit.   Allergies  Allergen Reactions   Penicillins Rash   Objective:   Today's Vitals   07/29/23 1521  BP: 136/70  Pulse: 69  Temp: 97.9 F (36.6 C)  TempSrc: Temporal  SpO2: 99%  Weight: 143 lb 6.4 oz (65 kg)  Height: 5\' 3"  (1.6 m)   Body mass index is 25.4 kg/m.   General: Well developed, well nourished. No acute distress. Psych: Alert and oriented. Normal mood and affect.  Health Maintenance Due  Topic Date Due   HIV Screening  Never done   Hepatitis C Screening  Never done     Lab Results: Component Ref Range & Units 2 wk ago  Aldosterone 0.0 - 30.0 ng/dL 2.5  Renin Activity, Plasma 0.167 - 5.380 ng/mL/hr 2.361  Aldos/Renin Ratio 0.0 - 30.0 1.1  Comment:                          Units:      ng/dL per ng/mL/hr      Latest Ref Rng & Units 07/15/2023    3:53 PM 04/25/2023    9:57 AM 04/04/2023    9:59 AM  BMP  Glucose 70 - 99 mg/dL 94  97  88   BUN 6 - 24 mg/dL 11  9  10    Creatinine 0.57 - 1.00 mg/dL 9.62  9.52  8.41   BUN/Creat Ratio 9 - 23 16     Sodium 134 - 144 mmol/L 132  137  137   Potassium 3.5 - 5.2 mmol/L 4.0  3.9  4.4   Chloride 96 - 106 mmol/L 90  102  101   CO2 20 - 29 mmol/L 25  26  28    Calcium 8.7 - 10.2 mg/dL 9.6  9.1  9.5    Assessment & Plan:   Problem List Items Addressed This Visit       Cardiovascular and Mediastinum   Essential hypertension - Primary    Ms. Wrench's BP was much improved today. Her recent lab tests did not show any sign of hyperaldosteronism. I will continue her on lisinopril 40 mg daily and spironolactone 25 mg daily.  Were we to have further issues with blood pressure control, we could consider switching to an ARB form her ACE-I. I recommend against that right now, as we would end up  having to restart titration of her medicine.        Digestive   Gastroesophageal reflux disease    Symptoms are increased. I will continue her on Nexium (esomeprazole) 40 mg daily and see if we can get her pharmacy to provide her with the  name brand product. I will add a 37-month course of sucralfate to see if this allows improved healing. We did discuss potential use of aloe vera juice. She felt the slimy quality of this might not be palatable for her.      Relevant Medications   sucralfate (CARAFATE) 1 GM/10ML suspension   NEXIUM 40 MG capsule     Other   Hyponatremia    The last BMP showed the hyponatremia had recurred despite her being off of thiazide diuretics at that point. I will plan to check a urine sodium and urine osmolarity at her next visit.      Vitamin B12 deficiency    We will plan to check her Vitamin B12 level at her next visit.       Return in about 6 weeks (around 09/09/2023) for Reassessment.   Loyola Mast, MD

## 2023-07-29 NOTE — Assessment & Plan Note (Signed)
We will plan to check her Vitamin B12 level at her next visit.

## 2023-07-29 NOTE — Assessment & Plan Note (Signed)
Maria Mccarty BP was much improved today. Her recent lab tests did not show any sign of hyperaldosteronism. I will continue her on lisinopril 40 mg daily and spironolactone 25 mg daily.  Were we to have further issues with blood pressure control, we could consider switching to an ARB form her ACE-I. I recommend against that right now, as we would end up having to restart titration of her medicine.

## 2023-08-19 ENCOUNTER — Encounter: Payer: Self-pay | Admitting: Family Medicine

## 2023-08-19 ENCOUNTER — Ambulatory Visit: Payer: Medicaid Other | Admitting: Family Medicine

## 2023-08-19 VITALS — HR 72 | Temp 97.7°F | Ht 63.0 in | Wt 143.6 lb

## 2023-08-19 DIAGNOSIS — K2970 Gastritis, unspecified, without bleeding: Secondary | ICD-10-CM | POA: Insufficient documentation

## 2023-08-19 DIAGNOSIS — E871 Hypo-osmolality and hyponatremia: Secondary | ICD-10-CM

## 2023-08-19 DIAGNOSIS — R079 Chest pain, unspecified: Secondary | ICD-10-CM

## 2023-08-19 DIAGNOSIS — I10 Essential (primary) hypertension: Secondary | ICD-10-CM

## 2023-08-19 DIAGNOSIS — Z122 Encounter for screening for malignant neoplasm of respiratory organs: Secondary | ICD-10-CM

## 2023-08-19 DIAGNOSIS — F411 Generalized anxiety disorder: Secondary | ICD-10-CM

## 2023-08-19 MED ORDER — ESCITALOPRAM OXALATE 5 MG PO TABS
5.0000 mg | ORAL_TABLET | Freq: Every day | ORAL | 0 refills | Status: DC
Start: 2023-08-19 — End: 2024-03-09

## 2023-08-19 NOTE — Assessment & Plan Note (Signed)
Recent EGD showed gastritis. Continue Nexium 40 mg dialy and sucralfate 1 gm qid. Follow-up with GI regarding biopsy and culture results.

## 2023-08-19 NOTE — Assessment & Plan Note (Signed)
BP is mildly elevated today. Continue lisinopril 40 mg daily and spironolactone 25 mg daily.

## 2023-08-19 NOTE — Assessment & Plan Note (Signed)
The last BMP showed the hyponatremia had recurred despite her being off of thiazide diuretics at that point. I will check a urine sodium and urine osmolarity today to further assess.

## 2023-08-19 NOTE — Progress Notes (Signed)
[facility] [address] [facility] [address] [place] [address] Dept: [phone] Dept Fax: [phone]  Chronic Care Office Visit  Subjective:    Patient ID: Maria Mccarty, female    DOB: 12/01/1970, 52 y.o..   MRN: [id number]  Chief Complaint  Patient presents with   Abdominal Pain    C/o having Upper right abdominal pain.   ? anxiety   History of Present Illness:  Patient is in today for reassessment of chronic medical issues.  Ms. Ast notes she continues to have epigastric pain. At times this seems to radiate around to the right lower chest area. She notes some of this started when she had a fall last summer. She has a history of GERD, pharyngeal dysphagia, and laryngopharyngeal reflux. She was seen for a recent EGD which did not show any sign of esophagitis, but some generalized gastritis. Biopsy and culture results are pending. She is manage don Nexium 40 mg daily and sucralfate 1 gm qid. Ms. Bick had a CT scan of the abdomen last July. This did show and area of linear density in the right lower lung felt to either be scar tissue or atelectasis. It worries her that she has a significant smoking history. She asks about having an LDCT to assess.  Ms. Fritsch has a history of hypertension. She is managed on spironolactone 25 mg and lisinopril 40 mg daily. She was previously on HCTZ, but had developed chronic hyponatremia. This improved after stopping the HCTZ.  I tried prescribing amlodipine, but she developed ankle swelling and constipation. Her most recent chemistries showed recurrence of hyponatremia. Aldosterone/renin testing has ruled out hyperaldosteronism.  Anxiety may be part of what is triggering this. She notes she is finally coming around tot he idea that she may need medication to help with anxiety. She admits to some degree of excessive worries. She wonder sif all of this does impact her GI and blood pressure issues.  Past  Medical History: Patient Active Problem List   Diagnosis Date Noted   Adrenal adenoma, left 07/15/2023   Meralgia paraesthetica, left 04/04/2023   Vitamin B12 deficiency 02/15/2023   Numbness and tingling of both legs 02/14/2023   Uterine fibroid 12/06/2022   Left sided sciatica 12/06/2022   Cervical spondylosis 10/05/2022   Hyponatremia 02/17/2022   Pharyngeal dysphagia 01/05/2022   Anxiety, generalized 09/11/2020   Essential hypertension 01/02/2020   Gastroesophageal reflux disease    Laryngopharyngeal reflux (LPR) 05/01/2018   Borderline hyperlipidemia 02/23/2018   Chronic idiopathic constipation 12/21/2017   S/P LEEP 12/21/2016   Past Surgical History:  Procedure Laterality Date   24 HOUR PH STUDY N/A 04/24/2018   Procedure: 24 HOUR PH STUDY;  Surgeon: Napoleon Form, MD;  [facility]: [facility];  Service: Endoscopy;  Laterality: N/A;   CERVICAL BIOPSY  W/ LOOP ELECTRODE EXCISION     COLPOSCOPY     ESOPHAGEAL MANOMETRY N/A 04/24/2018   Procedure: ESOPHAGEAL MANOMETRY (EM);  Surgeon: Napoleon Form, MD;  [facility]: [facility];  Service: Endoscopy;  Laterality: N/A;   LEEP     PH IMPEDANCE STUDY  04/24/2018   Procedure: PH IMPEDANCE STUDY;  Surgeon: Napoleon Form, MD;  [facility]: [facility];  Service: Endoscopy;;   TONSILLECTOMY     Family History  Problem Relation Age of Onset   Cancer Mother        Colon   Alcohol abuse Mother    Hypertension Mother    Peripheral Artery Disease Mother  Cancer Brother        Kidney   Heart disease Brother    Hypertension Brother    Hypertension Brother    Stroke Paternal Grandmother    Diabetes Paternal Grandfather    Outpatient Medications Prior to Visit  Medication Sig Dispense Refill   Cholecalciferol (VITAMIN D) 125 MCG (5000 UT) CAPS Take 1 capsule by mouth daily.     cyanocobalamin (VITAMIN B12) 1000 MCG tablet Take 1 tablet (1,000 mcg total) by mouth daily. 100 tablet 2   hydrOXYzine (ATARAX) 10 MG tablet  Take 1 tablet (10 mg total) by mouth 3 (three) times daily as needed for anxiety. 90 tablet 1   lisinopril (ZESTRIL) 40 MG tablet Take 1 tablet (40 mg total) by mouth daily. 90 tablet 3   NEXIUM 40 MG capsule Take 1 capsule (40 mg total) by mouth daily at 12 noon. 30 capsule 11   spironolactone (ALDACTONE) 25 MG tablet Take 1 tablet (25 mg total) by mouth daily. 90 tablet 3   sucralfate (CARAFATE) 1 GM/10ML suspension Take 10 mLs (1 g total) by mouth 4 (four) times daily -  with meals and at bedtime. 420 mL 0   No facility-administered medications prior to visit.   Allergies  Allergen Reactions   Penicillins Rash   Objective:   Today's Vitals   08/19/23 1343  Pulse: 72  Temp: 97.7 F (36.5 C)  TempSrc: Temporal  SpO2: 100%  Weight: 143 lb 9.6 oz (65.1 kg)  Height: 5\' 3"  (1.6 m)   Body mass index is 25.44 kg/m.   General: Well developed, well nourished. No acute distress. Psych: Alert and oriented. Normal mood and affect.  Health Maintenance Due  Topic Date Due   HIV Screening  Never done   Hepatitis C Screening  Never done   Lab Results    Latest Ref Rng & Units 07/15/2023    3:53 PM 04/25/2023    9:57 AM 04/04/2023    9:59 AM  BMP  Glucose 70 - 99 mg/dL 94  97  88   BUN 6 - 24 mg/dL 11  9  10    Creatinine 0.57 - 1.00 mg/dL 2.70  6.23  7.62   BUN/Creat Ratio 9 - 23 16     Sodium 134 - 144 mmol/L 132  137  137   Potassium 3.5 - 5.2 mmol/L 4.0  3.9  4.4   Chloride 96 - 106 mmol/L 90  102  101   CO2 20 - 29 mmol/L 25  26  28    Calcium 8.7 - 10.2 mg/dL 9.6  9.1  9.5      Assessment & Plan:   Problem List Items Addressed This Visit       Cardiovascular and Mediastinum   Essential hypertension    BP is mildly elevated today. Continue lisinopril 40 mg daily and spironolactone 25 mg daily.        Digestive   Gastritis without bleeding    Recent EGD showed gastritis. Continue Nexium 40 mg dialy and sucralfate 1 gm qid. Follow-up with GI regarding biopsy and  culture results.        Other   Anxiety, generalized    I agree with the idea of trying an SSRI to address her anxiety. We discussed expected results of medication. I will start her on escitalopram 5 mg daily and see her back in 6 weeks.      Relevant Medications   escitalopram (LEXAPRO) 5 MG tablet   Hyponatremia  The last BMP showed the hyponatremia had recurred despite her being off of thiazide diuretics at that point. I will check a urine sodium and urine osmolarity today to further assess.      Relevant Orders   Sodium, urine, random   Osmolality, urine   Basic metabolic panel   Other Visit Diagnoses     Right-sided chest pain    -  Primary   Etiology unclear. I will proceed with further imaging.   Relevant Orders   CT CHEST LUNG CANCER SCREENING LOW DOSE WO CONTRAST   Screening for lung cancer       Although I feel she may have benefited from a CT of chest, Ms. Shea Evans is concerned about accumulative radiation exposure, so will proceed with LDCT.   Relevant Orders   CT CHEST LUNG CANCER SCREENING LOW DOSE WO CONTRAST       Return in about 6 weeks (around 09/30/2023) for Reassessment.   Loyola Mast, MD

## 2023-08-19 NOTE — Assessment & Plan Note (Signed)
I agree with the idea of trying an SSRI to address her anxiety. We discussed expected results of medication. I will start her on escitalopram 5 mg daily and see her back in 6 weeks.

## 2023-08-22 ENCOUNTER — Encounter: Payer: Self-pay | Admitting: Family Medicine

## 2023-08-25 LAB — BASIC METABOLIC PANEL
BUN/Creatinine Ratio: 16 (ref 9–23)
BUN: 11 mg/dL (ref 6–24)
CO2: 26 mmol/L (ref 20–29)
Calcium: 9.3 mg/dL (ref 8.7–10.2)
Chloride: 100 mmol/L (ref 96–106)
Creatinine, Ser: 0.68 mg/dL (ref 0.57–1.00)
Glucose: 80 mg/dL (ref 70–99)
Potassium: 4.3 mmol/L (ref 3.5–5.2)
Sodium: 139 mmol/L (ref 134–144)
eGFR: 104 mL/min/{1.73_m2} (ref >59)

## 2023-08-25 LAB — OSMOLALITY, URINE: Osmolality, Ur: 263 mosm/kg

## 2023-08-25 LAB — SODIUM, URINE, RANDOM: Sodium, Ur: 62 mmol/L

## 2023-09-12 ENCOUNTER — Other Ambulatory Visit: Payer: Medicaid Other

## 2023-10-04 ENCOUNTER — Ambulatory Visit: Payer: Medicaid Other | Admitting: Family Medicine

## 2023-10-04 ENCOUNTER — Encounter: Payer: Self-pay | Admitting: Family Medicine

## 2023-10-04 VITALS — BP 134/80 | HR 101 | Temp 98.0°F | Ht 63.0 in | Wt 144.0 lb

## 2023-10-04 DIAGNOSIS — J0100 Acute maxillary sinusitis, unspecified: Secondary | ICD-10-CM | POA: Diagnosis not present

## 2023-10-04 DIAGNOSIS — M79645 Pain in left finger(s): Secondary | ICD-10-CM | POA: Diagnosis not present

## 2023-10-04 MED ORDER — AZITHROMYCIN 250 MG PO TABS: ORAL_TABLET | ORAL | 0 refills | Status: AC

## 2023-10-04 NOTE — Assessment & Plan Note (Signed)
 I suspect a sinusitis. I will prescribe a Z-pack. Maria Mccarty plans to wait and see if the symptoms are persistent over the next few days, before picking up her antibiotic and taking it.

## 2023-10-04 NOTE — Progress Notes (Signed)
 [facility] [address] [place] [address] Dept: [phone] Dept Fax: [phone]  Office Visit  Subjective:    Patient ID: Maria Mccarty, female    DOB: 1971-05-03, 52 y.o..   MRN: [id number]  Chief Complaint  Patient presents with   Hand Injury    C/o having LT knuckle pain x 1 month.  Has been using ice.  Also having sins congestion x 1 week has been taking Mucinex.    History of Present Illness:  Patient is in today for having injured her left 3rd MCP joint about a month ago. She notes she was reaching up into a paper towel dispenser and caught her knuckle on the opening. She has had some pain, swelling, and redness at the site since then. She is able to fully move the hand. She tried using ice on this, but feels it made it hurt worse.  Maria Mccarty has also been dealing with lingering URI symptoms, including nasal congestion, and sinus pressure over the past week. She has had maxillary sensitivity and increased pain with bending forward.  Past Medical History: Patient Active Problem List   Diagnosis Date Noted   Gastritis without bleeding 08/19/2023   Adrenal adenoma, left 07/15/2023   Meralgia paraesthetica, left 04/04/2023   Vitamin B12 deficiency 02/15/2023   Numbness and tingling of both legs 02/14/2023   Uterine fibroid 12/06/2022   Left sided sciatica 12/06/2022   Cervical spondylosis 10/05/2022   Hyponatremia 02/17/2022   Pharyngeal dysphagia 01/05/2022   Anxiety, generalized 09/11/2020   Essential hypertension 01/02/2020   Gastroesophageal reflux disease    Laryngopharyngeal reflux (LPR) 05/01/2018   Borderline hyperlipidemia 02/23/2018   Chronic idiopathic constipation 12/21/2017   S/P LEEP 12/21/2016   Past Surgical History:  Procedure Laterality Date   24 HOUR PH STUDY N/A 04/24/2018   Procedure: 24 HOUR PH STUDY;  Surgeon: Shila Gustav GAILS, MD;  Location: [facility];  Service:  Endoscopy;  Laterality: N/A;   CERVICAL BIOPSY  W/ LOOP ELECTRODE EXCISION     COLPOSCOPY     ESOPHAGEAL MANOMETRY N/A 04/24/2018   Procedure: ESOPHAGEAL MANOMETRY (EM);  Surgeon: Shila Gustav GAILS, MD;  Location: [facility];  Service: Endoscopy;  Laterality: N/A;   LEEP     PH IMPEDANCE STUDY  04/24/2018   Procedure: PH IMPEDANCE STUDY;  Surgeon: Shila Gustav GAILS, MD;  Location: [facility];  Service: Endoscopy;;   TONSILLECTOMY     Family History  Problem Relation Age of Onset   Cancer Mother        Colon   Alcohol abuse Mother    Hypertension Mother    Peripheral Artery Disease Mother    Cancer Brother        Kidney   Heart disease Brother    Hypertension Brother    Hypertension Brother    Stroke Paternal Grandmother    Diabetes Paternal Grandfather    Outpatient Medications Prior to Visit  Medication Sig Dispense Refill   Cholecalciferol (VITAMIN D ) 125 MCG (5000 UT) CAPS Take 1 capsule by mouth daily.     cyanocobalamin  (VITAMIN B12) 1000 MCG tablet Take 1 tablet (1,000 mcg total) by mouth daily. 100 tablet 2   escitalopram  (LEXAPRO ) 5 MG tablet Take 1 tablet (5 mg total) by mouth daily. 30 tablet 0   hydrOXYzine  (ATARAX ) 10 MG tablet Take 1 tablet (10 mg total) by mouth 3 (three) times daily as needed for anxiety. 90 tablet 1   lisinopril  (ZESTRIL )  40 MG tablet Take 1 tablet (40 mg total) by mouth daily. 90 tablet 3   NEXIUM  40 MG capsule Take 1 capsule (40 mg total) by mouth daily at 12 noon. 30 capsule 11   spironolactone  (ALDACTONE ) 25 MG tablet Take 1 tablet (25 mg total) by mouth daily. 90 tablet 3   sucralfate  (CARAFATE ) 1 GM/10ML suspension Take 10 mLs (1 g total) by mouth 4 (four) times daily -  with meals and at bedtime. 420 mL 0   No facility-administered medications prior to visit.   Allergies  Allergen Reactions   Penicillins Rash     Objective:   Today's Vitals   10/04/23 1034  BP: 134/80  Pulse: (!) 101  Temp: 98 F (36.7 C)  TempSrc:  Temporal  SpO2: 100%  Weight: 144 lb (65.3 kg)  Height: 5' 3 (1.6 m)   Body mass index is 25.51 kg/m.   General: Well developed, well nourished. No acute distress. HEENT: Normocephalic, non-traumatic. PERRL, EOMI. Conjunctiva clear. External ears normal.   EAC and TMs normal bilaterally. Nose with mild congestion and rhinorrhea. Moderate pain on   percussion over maxillary sinuses. Mucous membranes moist. Oropharynx clear. Good dentition. Extremities: The left 3rd MCP is mildly enlarged with redness. No increased warmth. Full ROM. No   instability noted. Normal distal sensation. Psych: Alert and oriented. Normal mood and affect.  Health Maintenance Due  Topic Date Due   HIV Screening  Never done   Hepatitis C Screening  Never done     Assessment & Plan:   Problem List Items Addressed This Visit       Respiratory   Acute non-recurrent maxillary sinusitis   I suspect a sinusitis. I will prescribe a Z-pack. Maria Mccarty plans to wait and see if the symptoms are persistent over the next few days, before picking up her antibiotic and taking it.      Relevant Medications   azithromycin  (ZITHROMAX ) 250 MG tablet   Other Visit Diagnoses       Pain in finger of left hand- left 3rd MCP joint    -  Primary   I will check an x-ray. I suspect this is a lingering bruise that will resolve with time.   Relevant Orders   DG Hand Complete Left       Return if symptoms worsen or fail to improve.   Maria CHRISTELLA Simpler, MD

## 2023-10-12 ENCOUNTER — Other Ambulatory Visit: Payer: Medicaid Other

## 2023-11-14 ENCOUNTER — Ambulatory Visit: Payer: Self-pay | Admitting: Family Medicine

## 2023-11-14 NOTE — Telephone Encounter (Signed)
  Chief Complaint: numbness and tingling Symptoms: numbness and tingling in both legs and both hands Frequency: intermittent x couple of weeks Pertinent Negatives: Patient denies chest pain, difficulty breathing, heart palpitation, changes in vision, changes in speech, unsteady on feet, headache, dizziness, back pain. Disposition: [] ED /[] Urgent Care (no appt availability in office) / [x] Appointment(In office/virtual)/ []  [facility]/ [] Home Care/ [] Refused Recommended Disposition /[]  Mobile Bus/ []  Follow-up with PCP Additional Notes: Patient states this is the same way she felt when she found out her B12 was low and started coming in for injections. She states she has been noncompliant with her B12 supplement over the holidays and feels like it might be low.   Copied from CRM [id number]. Topic: Clinical - Red Word Triage >> Nov 14, 2023  9:01 AM Crist Dominion wrote: Red Word that prompted transfer to Nurse Triage: Numbness and tingling Reason for Disposition  [1] Numbness or tingling in one or both hands AND [2] is a chronic symptom (recurrent or ongoing AND present > 4 weeks)  Answer Assessment - Initial Assessment Questions 1. SYMPTOM: "What is the main symptom you are concerned about?" (e.g., weakness, numbness)     Numbness and tingling in both hands and legs.  2. ONSET: "When did this start?" (minutes, hours, days; while sleeping)     Couple of weeks ago.  3. LAST NORMAL: "When was the last time you (the patient) were normal (no symptoms)?"     Couple of weeks ago.  4. PATTERN "Does this come and go, or has it been constant since it started?"  "Is it present now?"     Comes and goes over the last couple of weeks. When asked how long it lasts she states "oh honey I don't know". Numbness and tingling present now in legs.  5. CARDIAC SYMPTOMS: "Have you had any of the following symptoms: chest pain, difficulty breathing, palpitations?"     Denies.  6. NEUROLOGIC  SYMPTOMS: "Have you had any of the following symptoms: headache, dizziness, vision loss, double vision, changes in speech, unsteady on your feet?"     Denies.  7. OTHER SYMPTOMS: "Do you have any other symptoms?"     Denies.  Protocols used: Neurologic Deficit-A-AH

## 2023-11-14 NOTE — Telephone Encounter (Signed)
 Noted. Dm/cma

## 2023-11-15 ENCOUNTER — Ambulatory Visit: Payer: Medicaid Other | Admitting: Family Medicine

## 2023-11-15 ENCOUNTER — Encounter: Payer: Self-pay | Admitting: Family Medicine

## 2023-11-15 VITALS — BP 136/84 | Temp 97.6°F | Ht 63.0 in | Wt 146.0 lb

## 2023-11-15 DIAGNOSIS — Z131 Encounter for screening for diabetes mellitus: Secondary | ICD-10-CM

## 2023-11-15 DIAGNOSIS — E538 Deficiency of other specified B group vitamins: Secondary | ICD-10-CM | POA: Diagnosis not present

## 2023-11-15 DIAGNOSIS — E039 Hypothyroidism, unspecified: Secondary | ICD-10-CM

## 2023-11-15 DIAGNOSIS — R202 Paresthesia of skin: Secondary | ICD-10-CM

## 2023-11-15 LAB — HEMOGLOBIN A1C: Hgb A1c MFr Bld: 5 % (ref 4.6–6.5)

## 2023-11-15 NOTE — Progress Notes (Signed)
Established Patient Office Visit   Subjective:  Patient ID: Maria Mccarty, female    DOB: Apr 28, 1971  Age: 53 y.o. MRN: [id number]  Chief Complaint  Patient presents with   Numbness    Numbness and tingling in limbs and hands for 2 week. Pt thinks her B12 is low.     HPI Encounter Diagnoses  Name Primary?   Paresthesias in right hand Yes   Left leg paresthesias    Right leg paresthesias    Vitamin B12 deficiency    Hypothyroidism (acquired)    Screening for diabetes mellitus    2-week history of paresthesias in her right hand involving the third and fourth fingers as well as both of her legs.  Paresthesias involve the lateral aspects of both thighs and the entirety of both legs and feet.  She denies neck pain.  History of B12 deficiency and hypothyroidism.  She had been ill over the holidays and had not been taking her 1000 mcg of dose of [place] B12.  she recently restarted it.  Hypothyroidism is listed on her problem list.  TSH was in the high normal range back in July.   Review of Systems  Constitutional: Negative.   HENT: Negative.    Eyes:  Negative for blurred vision, discharge and redness.  Respiratory: Negative.    Cardiovascular: Negative.   Gastrointestinal:  Negative for abdominal pain.  Genitourinary: Negative.   Musculoskeletal: Negative.  Negative for back pain, myalgias and neck pain.  Skin:  Negative for rash.  Neurological:  Positive for tingling. Negative for loss of consciousness and weakness.  Endo/Heme/Allergies:  Negative for polydipsia.     Current Outpatient Medications:    Cholecalciferol (VITAMIN D) 125 MCG (5000 UT) CAPS, Take 1 capsule by mouth daily., Disp: , Rfl:    cyanocobalamin (VITAMIN B12) 1000 MCG tablet, Take 1 tablet (1,000 mcg total) by mouth daily., Disp: 100 tablet, Rfl: 2   escitalopram (LEXAPRO) 5 MG tablet, Take 1 tablet (5 mg total) by mouth daily., Disp: 30 tablet, Rfl: 0   hydrOXYzine (ATARAX) 10 MG tablet, Take 1  tablet (10 mg total) by mouth 3 (three) times daily as needed for anxiety., Disp: 90 tablet, Rfl: 1   lisinopril (ZESTRIL) 40 MG tablet, Take 1 tablet (40 mg total) by mouth daily., Disp: 90 tablet, Rfl: 3   NEXIUM 40 MG capsule, Take 1 capsule (40 mg total) by mouth daily at 12 noon., Disp: 30 capsule, Rfl: 11   spironolactone (ALDACTONE) 25 MG tablet, Take 1 tablet (25 mg total) by mouth daily., Disp: 90 tablet, Rfl: 3   sucralfate (CARAFATE) 1 GM/10ML suspension, Take 10 mLs (1 g total) by mouth 4 (four) times daily -  with meals and at bedtime., Disp: 420 mL, Rfl: 0   Objective:     BP 136/84   Temp 97.6 F (36.4 C)   Ht 5\' 3"  (1.6 m)   Wt 146 lb (66.2 kg)   LMP 12/05/2017 (Exact Date)   SpO2 98%   BMI 25.86 kg/m    Physical Exam Constitutional:      General: She is not in acute distress.    Appearance: Normal appearance. She is not ill-appearing, toxic-appearing or diaphoretic.  HENT:     Head: Normocephalic and atraumatic.     Right Ear: External ear normal.     Left Ear: External ear normal.  Eyes:     General: No scleral icterus.       Right eye: No  discharge.        Left eye: No discharge.     Extraocular Movements: Extraocular movements intact.     Conjunctiva/sclera: Conjunctivae normal.  Cardiovascular:     Rate and Rhythm: Normal rate and regular rhythm.  Pulmonary:     Effort: Pulmonary effort is normal. No respiratory distress.     Breath sounds: No wheezing or rales.  Musculoskeletal:     Cervical back: Normal range of motion. No rigidity or tenderness. No pain with movement. Normal range of motion.  Lymphadenopathy:     Cervical: No cervical adenopathy.  Skin:    General: Skin is warm and dry.  Neurological:     Mental Status: She is alert and oriented to person, place, and time.  Psychiatric:        Mood and Affect: Mood normal.        Behavior: Behavior normal.      No results found for any visits on 11/15/23.    The 10-year ASCVD risk  score (Arnett DK, et al., 2019) is: 2%    Assessment & Plan:   Paresthesias in right hand  Left leg paresthesias  Right leg paresthesias  Vitamin B12 deficiency -     Vitamin B12  Hypothyroidism (acquired) -     TSH  Screening for diabetes mellitus -     Basic metabolic panel -     Hemoglobin A1c    Return Schedule follow-up with Dr. Veto Kemps if not improving.    Mliss Sax, MD

## 2023-11-16 LAB — BASIC METABOLIC PANEL
BUN: 9 mg/dL (ref 6–23)
CO2: 29 meq/L (ref 19–32)
Calcium: 9.2 mg/dL (ref 8.4–10.5)
Chloride: 101 meq/L (ref 96–112)
Creatinine, Ser: 0.66 mg/dL (ref 0.40–1.20)
GFR: 99.59 mL/min (ref >60.00)
Glucose, Bld: 86 mg/dL (ref 70–99)
Potassium: 4.2 meq/L (ref 3.5–5.1)
Sodium: 138 meq/L (ref 135–145)

## 2023-11-16 LAB — VITAMIN B12: Vitamin B-12: 454 pg/mL (ref 211–911)

## 2023-11-16 LAB — TSH: TSH: 4.3 u[IU]/mL (ref 0.35–5.50)

## 2023-11-21 DIAGNOSIS — R109 Unspecified abdominal pain: Secondary | ICD-10-CM | POA: Diagnosis not present

## 2023-11-21 DIAGNOSIS — R140 Abdominal distension (gaseous): Secondary | ICD-10-CM | POA: Diagnosis not present

## 2023-11-21 DIAGNOSIS — K5900 Constipation, unspecified: Secondary | ICD-10-CM | POA: Diagnosis not present

## 2023-11-21 DIAGNOSIS — R195 Other fecal abnormalities: Secondary | ICD-10-CM | POA: Diagnosis not present

## 2023-11-21 DIAGNOSIS — R12 Heartburn: Secondary | ICD-10-CM | POA: Diagnosis not present

## 2023-11-21 DIAGNOSIS — R1084 Generalized abdominal pain: Secondary | ICD-10-CM | POA: Diagnosis not present

## 2023-11-23 ENCOUNTER — Encounter (HOSPITAL_BASED_OUTPATIENT_CLINIC_OR_DEPARTMENT_OTHER): Payer: Self-pay

## 2023-11-23 ENCOUNTER — Emergency Department (HOSPITAL_BASED_OUTPATIENT_CLINIC_OR_DEPARTMENT_OTHER)
Admission: EM | Admit: 2023-11-23 | Discharge: 2023-11-23 | Disposition: A | Discharge status: Home / Self Care | Payer: Medicaid Other

## 2023-11-23 ENCOUNTER — Emergency Department (HOSPITAL_BASED_OUTPATIENT_CLINIC_OR_DEPARTMENT_OTHER): Payer: Medicaid Other

## 2023-11-23 ENCOUNTER — Other Ambulatory Visit: Payer: Self-pay

## 2023-11-23 DIAGNOSIS — I10 Essential (primary) hypertension: Secondary | ICD-10-CM | POA: Diagnosis not present

## 2023-11-23 DIAGNOSIS — J101 Influenza due to other identified influenza virus with other respiratory manifestations: Secondary | ICD-10-CM | POA: Insufficient documentation

## 2023-11-23 DIAGNOSIS — R000 Tachycardia, unspecified: Secondary | ICD-10-CM | POA: Diagnosis not present

## 2023-11-23 DIAGNOSIS — Z20822 Contact with and (suspected) exposure to covid-19: Secondary | ICD-10-CM | POA: Insufficient documentation

## 2023-11-23 DIAGNOSIS — I700 Atherosclerosis of aorta: Secondary | ICD-10-CM | POA: Diagnosis not present

## 2023-11-23 DIAGNOSIS — R059 Cough, unspecified: Secondary | ICD-10-CM | POA: Diagnosis not present

## 2023-11-23 DIAGNOSIS — Z79899 Other long term (current) drug therapy: Secondary | ICD-10-CM | POA: Insufficient documentation

## 2023-11-23 DIAGNOSIS — J3489 Other specified disorders of nose and nasal sinuses: Secondary | ICD-10-CM | POA: Insufficient documentation

## 2023-11-23 LAB — RESP PANEL BY RT-PCR (RSV, FLU A&B, COVID)  RVPGX2
Influenza A by PCR: POSITIVE — AB
Influenza B by PCR: NEGATIVE
Resp Syncytial Virus by PCR: NEGATIVE
SARS Coronavirus 2 by RT PCR: NEGATIVE

## 2023-11-23 MED ORDER — IBUPROFEN 800 MG PO TABS
800.0000 mg | ORAL_TABLET | Freq: Once | ORAL | Status: AC
  Administered 2023-11-23: 800 mg via ORAL
  Filled 2023-11-23: qty 1

## 2023-11-23 NOTE — ED Triage Notes (Addendum)
Pt reports throat itchy yesterday. Woke this morning with croupy cough and it hurts in center of chest when coughing. Pt reports she took left over azithromycin this morning

## 2023-11-23 NOTE — Discharge Instructions (Signed)
You have been seen today for your complaint of flu symptoms. Your lab work was positive for influenza A. Your imaging was reassuring. Your discharge medications include Alternate tylenol and ibuprofen for pain. You may alternate these every 4 hours. You may take up to 800 mg of ibuprofen at a time and up to 1000 mg of tylenol. You may use Claritin/Zyrtec and Flonase for your congestion and runny nose.  Use these medicines daily until your symptoms resolve. You may use Delsym, Robitussin DM or Mucinex DM for your cough. Home care instructions are as follows:  Drink plenty of water.  Eat frequent small meals if you lose your appetite. Follow up with: Primary care provider Please seek immediate medical care if you develop any of the following symptoms: You become short of breath or have trouble breathing. Your skin or nails turn blue. You have very bad pain or stiffness in your neck. You get a sudden headache or pain in your face or ear. You vomit each time you eat or drink. At this time there does not appear to be the presence of an emergent medical condition, however there is always the potential for conditions to change. Please read and follow the below instructions.  Do not take your medicine if  develop an itchy rash, swelling in your mouth or lips, or difficulty breathing; call 911 and seek immediate emergency medical attention if this occurs.  You may review your lab tests and imaging results in their entirety on your MyChart account.  Please discuss all results of fully with your primary care provider and other specialist at your follow-up visit.  Note: Portions of this text may have been transcribed using voice recognition software. Every effort was made to ensure accuracy; however, inadvertent computerized transcription errors may still be present.

## 2023-11-23 NOTE — ED Provider Notes (Signed)
[facility] Provider Note   CSN: [id number] Arrival date & time: 11/23/23  0856     History  Chief Complaint  Patient presents with   Cough    Maria Mccarty is a 53 y.o. female.  With history of hypertension, anxiety, GERD presenting to the ED for evaluation of flu symptoms including cough, congestion, body aches.  She developed a sore throat last night and woke up with the remainder of her symptoms this morning.  States she was in [facility] 2 days ago for testing and is wondering if she was exposed to sick contacts at that time.  She did not receive her flu vaccine this year.  No fevers or chills.  Cough is nonproductive.  She reports anterior chest wall pain after episodes of coughing.  She states she was sent a prescription for azithromycin in December, filled this prescription but never initiated the medicine.  She states she started taking the antibiotic today.  She has otherwise not been using anything for her symptoms.  HPI     Home Medications Prior to Admission medications   Medication Sig Start Date End Date Taking? Authorizing Provider  Cholecalciferol (VITAMIN D) 125 MCG (5000 UT) CAPS Take 1 capsule by mouth daily.    [provider]  cyanocobalamin (VITAMIN B12) 1000 MCG tablet Take 1 tablet (1,000 mcg total) by mouth daily. 04/04/23   Loyola Mast, MD  escitalopram (LEXAPRO) 5 MG tablet Take 1 tablet (5 mg total) by mouth daily. 08/19/23   Loyola Mast, MD  hydrOXYzine (ATARAX) 10 MG tablet Take 1 tablet (10 mg total) by mouth 3 (three) times daily as needed for anxiety. 12/06/22   Loyola Mast, MD  lisinopril (ZESTRIL) 40 MG tablet Take 1 tablet (40 mg total) by mouth daily. 12/06/22   Loyola Mast, MD  NEXIUM 40 MG capsule Take 1 capsule (40 mg total) by mouth daily at 12 noon. 07/29/23   Loyola Mast, MD  spironolactone (ALDACTONE) 25 MG tablet Take 1 tablet (25 mg total) by mouth daily.  07/15/23   Loyola Mast, MD  sucralfate (CARAFATE) 1 GM/10ML suspension Take 10 mLs (1 g total) by mouth 4 (four) times daily -  with meals and at bedtime. 07/29/23   Loyola Mast, MD      Allergies    Penicillins    Review of Systems   Review of Systems  HENT:  Positive for congestion and rhinorrhea.   Respiratory:  Positive for cough.   All other systems reviewed and are negative.   Physical Exam Updated Vital Signs BP (!) 153/99   Pulse (!) 106   Temp 99.3 F (37.4 C) (Oral)   Resp 16   Wt 64.4 kg   LMP 12/05/2017 (Exact Date)   SpO2 99%   BMI 25.15 kg/m  Physical Exam Vitals and nursing note reviewed.  Constitutional:      General: She is not in acute distress.    Appearance: Normal appearance. She is normal weight. She is not ill-appearing.     Comments: Resting comfortably in bed  HENT:     Head: Normocephalic and atraumatic.     Nose: Congestion and rhinorrhea present.  Cardiovascular:     Rate and Rhythm: Regular rhythm. Tachycardia present.     Heart sounds: No murmur heard. Pulmonary:     Effort: Pulmonary effort is normal. No respiratory distress.     Breath sounds: No wheezing,  rhonchi or rales.  Abdominal:     General: Abdomen is flat.  Musculoskeletal:        General: Normal range of motion.     Cervical back: Neck supple.  Skin:    General: Skin is warm and dry.  Neurological:     Mental Status: She is alert and oriented to person, place, and time.  Psychiatric:        Mood and Affect: Mood normal.        Behavior: Behavior normal.     ED Results / Procedures / Treatments   Labs (all labs ordered are listed, but only abnormal results are displayed) Labs Reviewed  RESP PANEL BY RT-PCR (RSV, FLU A&B, COVID)  [id number] - Abnormal; Notable for the following components:      Result Value   Influenza A by PCR POSITIVE (*)    All other components within normal limits    EKG EKG Interpretation Date/Time:  Wednesday November 23 2023  09:09:56 EST Ventricular Rate:  111 PR Interval:  159 QRS Duration:  86 QT Interval:  317 QTC Calculation: 431 R Axis:   74  Text Interpretation: Sinus tachycardia Nonspecific repol abnormality, inferior leads Confirmed by Estanislado Pandy [id number]) on 11/23/2023 9:40:41 AM  Radiology DG Chest 2 View Result Date: 11/23/2023 CLINICAL DATA:  Cough. EXAM: CHEST - 2 VIEW COMPARISON:  None Available. FINDINGS: The heart size and mediastinal contours are within normal limits. Aortic atherosclerosis. No focal consolidation, pleural effusion, or pneumothorax. No acute osseous abnormality. IMPRESSION: No acute cardiopulmonary findings. Electronically Signed   By: Hart Robinsons M.D.   On: 11/23/2023 10:15    Procedures Procedures    Medications Ordered in ED Medications  ibuprofen (ADVIL) tablet 800 mg (800 mg Oral Given 11/23/23 0935)    ED Course/ Medical Decision Making/ A&P                                 Medical Decision Making This patient presents to the ED for concern of flulike symptoms, this involves an extensive number of treatment options, and is a complaint that carries with it a high risk of complications and morbidity.  The differential diagnosis includes flu, COVID, RSV, pneumonia  My initial workup includes respiratory panel, chest x-ray, NSAID  Additional history obtained from: Nursing notes from this visit.  I ordered, reviewed and interpreted labs which include: Respiratory panel.  Influenza A positive.  I ordered imaging studies including chest x-ray I independently visualized and interpreted imaging which showed negative I agree with the radiologist interpretation  53 year old female presenting to the ED for evaluation flulike symptoms.  Symptoms began yesterday and have worsened today.  She did spend some time in the [facility] the day before symptom onset.  Did not receive her flu vaccine this year.  She denies any fevers at home.  She is mildly  tachycardic here.  Respiratory panel positive for influenza A.  Likely source of her symptoms.  Chest x-ray negative.  I had a shared decision-making conversation with the patient regarding Tamiflu.  She declines.  She was educated on typical timeline of symptoms and supportive care.  She was encouraged to quarantine until symptoms improved.  She was given return precautions.  Stable at discharge.  At this time there does not appear to be any evidence of an acute emergency medical condition and the patient appears stable for discharge with appropriate outpatient follow up.  Diagnosis was discussed with patient who verbalizes understanding of care plan and is agreeable to discharge. I have discussed return precautions with patient who verbalizes understanding. Patient encouraged to follow-up with their PCP within 1 week. All questions answered.  Note: Portions of this report may have been transcribed using voice recognition software. Every effort was made to ensure accuracy; however, inadvertent computerized transcription errors may still be present.        Final Clinical Impression(s) / ED Diagnoses Final diagnoses:  Influenza A    Rx / DC Orders ED Discharge Orders     None         Michelle Piper, PA-C 11/23/23 1024    Coral Spikes, DO 11/23/23 1413

## 2023-11-24 ENCOUNTER — Telehealth: Payer: Self-pay

## 2023-11-24 NOTE — Transitions of Care (Post Inpatient/ED Visit) (Signed)
   11/24/2023  Name: Maria Mccarty MRN: [id number] DOB: 01-06-1971  Today's TOC FU Call Status: Today's TOC FU Call Status:: Successful TOC FU Call Completed TOC FU Call Complete Date: 11/24/23 Patient's Name and Date of Birth confirmed.  Transition Care Management Follow-up Telephone Call Date of Discharge: 11/23/23 Discharge Facility: [facility]) Type of Discharge: Emergency Department Reason for ED Visit: Other: (Influenza) How have you been since you were released from the hospital?: Same Any questions or concerns?: No  Items Reviewed: Did you receive and understand the discharge instructions provided?: Yes Medications obtained,verified, and reconciled?: Yes (Medications Reviewed) Any new allergies since your discharge?: No Dietary orders reviewed?: No Do you have support at home?: Yes People in Home: significant other Name of Support/Comfort Primary Source: husband  Medications Reviewed Today: Medications Reviewed Today     Reviewed by Waymond Cera, CMA (Certified Medical Assistant) on 11/24/23 at 1320  Med List Status: <None>   Medication Order Taking? Sig Documenting Provider Last Dose Status Informant  Cholecalciferol (VITAMIN D) 125 MCG (5000 UT) CAPS [id number] Yes Take 1 capsule by mouth daily. [provider] Taking Active   cyanocobalamin (VITAMIN B12) 1000 MCG tablet [id number] Yes Take 1 tablet (1,000 mcg total) by mouth daily. Loyola Mast, MD Taking Active   escitalopram (LEXAPRO) 5 MG tablet [id number] Yes Take 1 tablet (5 mg total) by mouth daily. Loyola Mast, MD Taking Active   hydrOXYzine (ATARAX) 10 MG tablet [id number] Yes Take 1 tablet (10 mg total) by mouth 3 (three) times daily as needed for anxiety. Loyola Mast, MD Taking Active   lisinopril (ZESTRIL) 40 MG tablet [id number] Yes Take 1 tablet (40 mg total) by mouth daily. Loyola Mast, MD Taking Active   NEXIUM 40 MG capsule [id number] Yes Take 1 capsule (40 mg total) by  mouth daily at 12 noon. Loyola Mast, MD Taking Active   spironolactone (ALDACTONE) 25 MG tablet [id number] Yes Take 1 tablet (25 mg total) by mouth daily. Loyola Mast, MD Taking Active   sucralfate (CARAFATE) 1 GM/10ML suspension [id number] Yes Take 10 mLs (1 g total) by mouth 4 (four) times daily -  with meals and at bedtime. Loyola Mast, MD Taking Active             Home Care and Equipment/Supplies: Were Home Health Services Ordered?: NA Any new equipment or medical supplies ordered?: NA  Functional Questionnaire: Do you need assistance with bathing/showering or dressing?: No Do you need assistance with meal preparation?: No Do you need assistance with eating?: No Do you have difficulty maintaining continence: No Do you need assistance with getting out of bed/getting out of a chair/moving?: No Do you have difficulty managing or taking your medications?: No  Follow up appointments reviewed: PCP Follow-up appointment confirmed?: No MD Provider Line Number:[phone] Given: Yes Specialist Hospital Follow-up appointment confirmed?: NA Do you need transportation to your follow-up appointment?: No Do you understand care options if your condition(s) worsen?: Yes-patient verbalized understanding   . SIGNATURE. Ian Bushman, cma

## 2023-12-03 ENCOUNTER — Emergency Department (HOSPITAL_BASED_OUTPATIENT_CLINIC_OR_DEPARTMENT_OTHER)
Admission: EM | Admit: 2023-12-03 | Discharge: 2023-12-03 | Disposition: A | Discharge status: Home / Self Care | Attending: Emergency Medicine | Admitting: Emergency Medicine

## 2023-12-03 ENCOUNTER — Emergency Department (HOSPITAL_BASED_OUTPATIENT_CLINIC_OR_DEPARTMENT_OTHER)

## 2023-12-03 ENCOUNTER — Encounter (HOSPITAL_BASED_OUTPATIENT_CLINIC_OR_DEPARTMENT_OTHER): Payer: Self-pay | Admitting: Emergency Medicine

## 2023-12-03 ENCOUNTER — Other Ambulatory Visit: Payer: Self-pay

## 2023-12-03 DIAGNOSIS — R051 Acute cough: Secondary | ICD-10-CM | POA: Diagnosis not present

## 2023-12-03 DIAGNOSIS — R059 Cough, unspecified: Secondary | ICD-10-CM | POA: Diagnosis not present

## 2023-12-03 DIAGNOSIS — J45909 Unspecified asthma, uncomplicated: Secondary | ICD-10-CM | POA: Diagnosis not present

## 2023-12-03 MED ORDER — PROMETHAZINE-DM 6.25-15 MG/5ML PO SYRP: 5.0000 mL | ORAL_SOLUTION | Freq: Four times a day (QID) | ORAL | 0 refills | Status: DC | PRN

## 2023-12-03 MED ORDER — BENZONATATE 100 MG PO CAPS: 100.0000 mg | ORAL_CAPSULE | Freq: Three times a day (TID) | ORAL | 0 refills | Status: DC

## 2023-12-03 NOTE — Discharge Instructions (Signed)
 Please read and follow all provided instructions.  Your diagnoses today include:  1. Acute cough    Tests performed today include: Chest x-ray: Was negative for pneumonia or other problems Vital signs. See below for your results today.   Medications prescribed:  Tessalon Perles - cough suppressant medication  Promethazine dextromethorphan - cough suppressant syrup  DO NOT drive or perform any activities that require you to be awake and alert because this medicine can make you drowsy.   Take any prescribed medications only as directed.  Home care instructions:  Follow any educational materials contained in this packet.  BE VERY CAREFUL not to take multiple medicines containing Tylenol (also called acetaminophen). Doing so can lead to an overdose which can damage your liver and cause liver failure and possibly death.   Follow-up instructions: Please follow-up with your primary care provider in the next 7 days for further evaluation of your symptoms if not feeling better.  Return instructions:  Please return to the Emergency Department if you experience worsening symptoms.  Please return if you have any other emergent concerns.  Additional Information:  Your vital signs today were: BP (!) 151/104 (BP Location: Right Arm)   Pulse 81   Temp 98.4 F (36.9 C) (Oral)   Resp 18   Ht 5\' 3"  (1.6 m)   Wt 63.5 kg   LMP 12/05/2017 (Exact Date)   SpO2 100%   BMI 24.80 kg/m  If your blood pressure (BP) was elevated above 135/85 this visit, please have this repeated by your doctor within one month. --------------

## 2023-12-03 NOTE — ED Triage Notes (Signed)
 Pt states she had the flu on 2/19.25,  Pt states she is still having a cough, non productive.  No recent fever.

## 2023-12-03 NOTE — ED Provider Notes (Signed)
 [place] [facility] AT [facility] Provider Note   CSN: [id number] Arrival date & time: 12/03/23  1226     History  Chief Complaint  Patient presents with   Cough    Maria Mccarty is a 53 y.o. female.  Patient presents to the emergency department today for evaluation of ongoing cough.  Patient was diagnosed with influenza about 10 days ago.  She states that her symptoms have improved except for cough.  Cough has been persistent in the mornings and at night.  She has been trying over-the-counter cough medication without improvement.  No chest pains or shortness of breath.  She reports remote history of asthma but no current wheezing.  No leg swelling.       Home Medications Prior to Admission medications   Medication Sig Start Date End Date Taking? Authorizing Provider  benzonatate (TESSALON) 100 MG capsule Take 1 capsule (100 mg total) by mouth every 8 (eight) hours. 12/03/23  Yes Renne Crigler, PA-C  promethazine-dextromethorphan (PROMETHAZINE-DM) 6.25-15 MG/5ML syrup Take 5 mLs by mouth 4 (four) times daily as needed for cough. 12/03/23  Yes Renne Crigler, PA-C  Cholecalciferol (VITAMIN D) 125 MCG (5000 UT) CAPS Take 1 capsule by mouth daily.    [provider]  cyanocobalamin (VITAMIN B12) 1000 MCG tablet Take 1 tablet (1,000 mcg total) by mouth daily. 04/04/23   Loyola Mast, MD  escitalopram (LEXAPRO) 5 MG tablet Take 1 tablet (5 mg total) by mouth daily. 08/19/23   Loyola Mast, MD  hydrOXYzine (ATARAX) 10 MG tablet Take 1 tablet (10 mg total) by mouth 3 (three) times daily as needed for anxiety. 12/06/22   Loyola Mast, MD  lisinopril (ZESTRIL) 40 MG tablet Take 1 tablet (40 mg total) by mouth daily. 12/06/22   Loyola Mast, MD  NEXIUM 40 MG capsule Take 1 capsule (40 mg total) by mouth daily at 12 noon. 07/29/23   Loyola Mast, MD  spironolactone (ALDACTONE) 25 MG tablet Take 1 tablet (25 mg total) by mouth daily. 07/15/23   Loyola Mast, MD  sucralfate (CARAFATE) 1 GM/10ML suspension Take 10 mLs (1 g total) by mouth 4 (four) times daily -  with meals and at bedtime. 07/29/23   Loyola Mast, MD      Allergies    Penicillins    Review of Systems   Review of Systems  Physical Exam Updated Vital Signs BP (!) 151/104 (BP Location: Right Arm)   Pulse 81   Temp 98.4 F (36.9 C) (Oral)   Resp 18   Ht 5\' 3"  (1.6 m)   Wt 63.5 kg   LMP 12/05/2017 (Exact Date)   SpO2 100%   BMI 24.80 kg/m   Physical Exam Vitals and nursing note reviewed.  Constitutional:      Appearance: She is well-developed.  HENT:     Head: Normocephalic and atraumatic.     Jaw: No trismus.     Right Ear: External ear normal.     Left Ear: External ear normal.     Nose: Nose normal. No mucosal edema or rhinorrhea.     Mouth/Throat:     Mouth: Mucous membranes are moist. Mucous membranes are not dry. No oral lesions.     Pharynx: Uvula midline. No oropharyngeal exudate, posterior oropharyngeal erythema or uvula swelling.     Tonsils: No tonsillar abscesses.  Eyes:     General:        Right eye: No  discharge.        Left eye: No discharge.     Conjunctiva/sclera: Conjunctivae normal.  Cardiovascular:     Rate and Rhythm: Normal rate and regular rhythm.     Heart sounds: Normal heart sounds.  Pulmonary:     Effort: Pulmonary effort is normal. No respiratory distress.     Breath sounds: Normal breath sounds. No wheezing or rales.     Comments: Lungs clear to auscultation bilaterally, no wheezing. Abdominal:     Palpations: Abdomen is soft.     Tenderness: There is no abdominal tenderness.  Musculoskeletal:     Cervical back: Normal range of motion and neck supple.  Lymphadenopathy:     Cervical: No cervical adenopathy.  Skin:    General: Skin is warm and dry.  Neurological:     Mental Status: She is alert.  Psychiatric:        Mood and Affect: Mood normal.     ED Results / Procedures / Treatments   Labs (all labs  ordered are listed, but only abnormal results are displayed) Labs Reviewed - No data to display  EKG None  Radiology DG Chest 2 View Result Date: 12/03/2023 CLINICAL DATA:  Cough EXAM: CHEST - 2 VIEW COMPARISON:  Chest radiograph dated 11/23/2023. FINDINGS: The heart size and mediastinal contours are within normal limits. Vascular calcifications are seen in the aortic arch. Both lungs are clear. The visualized skeletal structures are unremarkable. IMPRESSION: No active cardiopulmonary disease. Electronically Signed   By: Romona Curls M.D.   On: 12/03/2023 14:28    Procedures Procedures    Medications Ordered in ED Medications - No data to display  ED Course/ Medical Decision Making/ A&P    Patient seen and examined. History obtained directly from patient. Work-up including labs, imaging, EKG ordered in triage, if performed, were reviewed.    Labs/EKG: None ordered  Imaging: Independently reviewed and interpreted.  This included: Chest x-ray, agree negative.  Medications/Fluids: None ordered  Most recent vital signs reviewed and are as follows: BP (!) 151/104 (BP Location: Right Arm)   Pulse 81   Temp 98.4 F (36.9 C) (Oral)   Resp 18   Ht 5\' 3"  (1.6 m)   Wt 63.5 kg   LMP 12/05/2017 (Exact Date)   SpO2 100%   BMI 24.80 kg/m   Initial impression: Postviral persistent cough, no pneumonia on chest x-ray.  We did discuss that it is not unusual to have persisting cough for several weeks after a viral illness such as influenza.  Home treatment plan: Advised also present, will give prescription for Tessalon and promethazine cough syrup for patient to try.  We did discuss sedating effects of the cough syrup and to only take at night.  Return instructions discussed with patient: New or worsening symptoms  Follow-up instructions discussed with patient: PCP in 1 week if not improving                                Medical Decision Making Amount and/or Complexity of Data  Reviewed Radiology: ordered.  Risk Prescription drug management.   Patient with persisting cough after recent infection with influenza.  She denies other constitutional or systemic symptoms.  No fevers.  Chest x-ray does not show pneumonia.  Low concern for secondary pneumonia or bacterial pneumonia after flu.  She looks very well.  Will treat symptomatically.        Final Clinical  Impression(s) / ED Diagnoses Final diagnoses:  Acute cough    Rx / DC Orders ED Discharge Orders          Ordered    promethazine-dextromethorphan (PROMETHAZINE-DM) 6.25-15 MG/5ML syrup  4 times daily PRN        12/03/23 1448    benzonatate (TESSALON) 100 MG capsule  Every 8 hours        12/03/23 1448              Renne Crigler, PA-C 12/03/23 1452    Pricilla Loveless, MD 12/04/23 [phone]

## 2023-12-05 ENCOUNTER — Telehealth: Payer: Self-pay

## 2023-12-05 NOTE — Transitions of Care (Post Inpatient/ED Visit) (Signed)
   12/05/2023  Name: Rhylynn Perdomo MRN: [id number] DOB: 05-13-1971  Today's TOC FU Call Status: Today's TOC FU Call Status:: Successful TOC FU Call Completed TOC FU Call Complete Date: 12/03/23 Patient's Name and Date of Birth confirmed.  Transition Care Management Follow-up Telephone Call Date of Discharge: 12/03/23 Discharge Facility: [facility] Type of Discharge: Emergency Department Reason for ED Visit: Other: How have you been since you were released from the hospital?: Better Any questions or concerns?: No  Items Reviewed: Did you receive and understand the discharge instructions provided?: Yes Medications obtained,verified, and reconciled?: Yes (Medications Reviewed) Any new allergies since your discharge?: No Dietary orders reviewed?: NA Do you have support at home?: Yes People in Home: spouse  Medications Reviewed Today: Medications Reviewed Today   Medications were not reviewed in this encounter     Home Care and Equipment/Supplies: Were Home Health Services Ordered?: NA Any new equipment or medical supplies ordered?: NA  Functional Questionnaire: Do you need assistance with bathing/showering or dressing?: No Do you need assistance with meal preparation?: No Do you need assistance with eating?: No Do you have difficulty maintaining continence: No Do you need assistance with getting out of bed/getting out of a chair/moving?: No Do you have difficulty managing or taking your medications?: No  Follow up appointments reviewed: PCP Follow-up appointment confirmed?: No MD Provider Line Number:[phone] Given: Yes Specialist Hospital Follow-up appointment confirmed?: NA Do you need transportation to your follow-up appointment?: No Do you understand care options if your condition(s) worsen?: Yes-patient verbalized understanding    SIGNATURE Marion Seese D, CMA

## 2024-02-07 ENCOUNTER — Telehealth: Payer: Self-pay | Admitting: Family Medicine

## 2024-02-07 DIAGNOSIS — I10 Essential (primary) hypertension: Secondary | ICD-10-CM

## 2024-02-07 MED ORDER — LISINOPRIL 40 MG PO TABS
40.0000 mg | ORAL_TABLET | Freq: Every day | ORAL | 0 refills | Status: DC
Start: 2024-02-07 — End: 2024-03-09

## 2024-02-07 NOTE — Telephone Encounter (Signed)
 Prescription Request  02/07/2024  LOV: 10/04/2023  What is the name of the medication or equipment? lisinopril  (ZESTRIL ) 40 MG tablet [[id number]]   Have you contacted your pharmacy to request a refill? No   Which pharmacy would you like this sent to?  [facility] [address] [place], [place] - [address] [place] [address] Phone: [phone] Fax: [phone]     Patient notified that their request is being sent to the clinical staff for review and that they should receive a response within 2 business days.   Please advise at Mobile [phone] (mobile)

## 2024-02-07 NOTE — Telephone Encounter (Signed)
RX sent to the pharmacy and patient notified VIA phone.  Dm/cma ° °

## 2024-03-09 ENCOUNTER — Ambulatory Visit: Admitting: Family Medicine

## 2024-03-09 ENCOUNTER — Encounter: Payer: Self-pay | Admitting: Family Medicine

## 2024-03-09 VITALS — BP 136/80 | HR 89 | Temp 97.6°F | Ht 63.0 in | Wt 142.0 lb

## 2024-03-09 DIAGNOSIS — I10 Essential (primary) hypertension: Secondary | ICD-10-CM

## 2024-03-09 DIAGNOSIS — E785 Hyperlipidemia, unspecified: Secondary | ICD-10-CM | POA: Diagnosis not present

## 2024-03-09 DIAGNOSIS — F411 Generalized anxiety disorder: Secondary | ICD-10-CM

## 2024-03-09 DIAGNOSIS — K760 Fatty (change of) liver, not elsewhere classified: Secondary | ICD-10-CM

## 2024-03-09 MED ORDER — LISINOPRIL 40 MG PO TABS
40.0000 mg | ORAL_TABLET | Freq: Every day | ORAL | 0 refills | Status: DC
Start: 2024-03-09 — End: 2024-08-29

## 2024-03-09 MED ORDER — ESCITALOPRAM OXALATE 5 MG PO TABS: 5.0000 mg | ORAL_TABLET | Freq: Every day | ORAL | 0 refills | Status: DC

## 2024-03-09 MED ORDER — HYDROXYZINE HCL 10 MG PO TABS
10.0000 mg | ORAL_TABLET | Freq: Three times a day (TID) | ORAL | 1 refills | Status: AC | PRN
Start: 2024-03-09 — End: ?

## 2024-03-09 MED ORDER — METOPROLOL SUCCINATE ER 25 MG PO TB24: 25.0000 mg | ORAL_TABLET | Freq: Every day | ORAL | 3 refills | Status: DC

## 2024-03-09 NOTE — Assessment & Plan Note (Signed)
 I will reassess her lipids today. Her AHA/ACC CV Risk is very low at this point. I recommend eating a heart-healthy diet (DASH diet or Mediterranean), getting 150 minutes of moderate-intensity exercise each week, maintaining a normal weight, and avoiding tobacco products. I reassured her that her previous heart evaluation has been very normal. Currently we should focus on optimally managing her BP.

## 2024-03-09 NOTE — Progress Notes (Signed)
 [facility] [address] [place] [address] Dept: [phone] Dept Fax: [phone]  Chronic Care Office Visit  Subjective:    Patient ID: Maria Mccarty, female    DOB: [birth year], 53 y.o..   MRN: [id number]  Chief Complaint  Patient presents with   Hypertension    F/u HTN/meds.   Wants a cardio referral, and c/o having some SOB off/on x 1 week with activity   History of Present Illness:  Patient is in today for reassessment of chronic medical issues.  Maria Mccarty has a history of hypertension. She is managed on spironolactone  25 mg and lisinopril  40 mg daily. She was previously on HCTZ, but had developed chronic hyponatremia. Aldosterone/renin testing has ruled out hyperaldosteronism. This improved after stopping the HCTZ.  I tried prescribing amlodipine , but she developed ankle swelling and constipation. Maria Mccarty feels frustrated by the lability of her blood pressure. She admits there are times when it is doing quite well, but others where it surges up.   Anxiety may be part of what is triggering this. She has some degree of excessive worries. I had tried starting her on escitalopram  in Nov., but she only took this for a short time.  Maria Mccarty notes that she had findings of atherosclerosis on a previous CT scan. Her mother had severe atherosclerotic heart disease. Maria Mccarty is very worried about how her heart is doing. She has been noting some shortness of breath. She says she would like to see a cardiologist and have a thorough evaluation of this. Maria Mccarty does admit her mother was a lifelong smoker. She quit herself 10 years ago.  Past Medical History: Patient Active Problem List   Diagnosis Date Noted   Nonalcoholic fatty liver 03/09/2024   Acute non-recurrent maxillary sinusitis 10/04/2023   Gastritis without bleeding 08/19/2023   Adrenal adenoma, left 07/15/2023   Meralgia paraesthetica, left 04/04/2023    Vitamin B12 deficiency 02/15/2023   Numbness and tingling of both legs 02/14/2023   Uterine fibroid 12/06/2022   Left sided sciatica 12/06/2022   Cervical spondylosis 10/05/2022   Hyponatremia 02/17/2022   Pharyngeal dysphagia 01/05/2022   Anxiety, generalized 09/11/2020   Essential hypertension 01/02/2020   Gastroesophageal reflux disease    Laryngopharyngeal reflux (LPR) 05/01/2018   Borderline hyperlipidemia 02/23/2018   Chronic idiopathic constipation 12/21/2017   S/P LEEP 12/21/2016   Past Surgical History:  Procedure Laterality Date   24 HOUR PH STUDY N/A 04/24/2018   Procedure: 24 HOUR PH STUDY;  Surgeon: Sergio Dandy, MD;  Location: [facility];  Service: Endoscopy;  Laterality: N/A;   CERVICAL BIOPSY  W/ LOOP ELECTRODE EXCISION     COLPOSCOPY     ESOPHAGEAL MANOMETRY N/A 04/24/2018   Procedure: ESOPHAGEAL MANOMETRY (EM);  Surgeon: Sergio Dandy, MD;  Location: [facility];  Service: Endoscopy;  Laterality: N/A;   LEEP     PH IMPEDANCE STUDY  04/24/2018   Procedure: PH IMPEDANCE STUDY;  Surgeon: Sergio Dandy, MD;  Location: [facility];  Service: Endoscopy;;   TONSILLECTOMY     Family History  Problem Relation Age of Onset   Cancer Mother        Colon   Alcohol abuse Mother    Hypertension Mother    Peripheral Artery Disease Mother    Cancer Brother        Kidney   Heart disease Brother    Hypertension Brother    Hypertension Brother    Stroke  Paternal Grandmother    Diabetes Paternal Grandfather    Outpatient Medications Prior to Visit  Medication Sig Dispense Refill   Cholecalciferol (VITAMIN D ) 125 MCG (5000 UT) CAPS Take 1 capsule by mouth daily.     cyanocobalamin  (VITAMIN B12) 1000 MCG tablet Take 1 tablet (1,000 mcg total) by mouth daily. 100 tablet 2   NEXIUM  40 MG capsule Take 1 capsule (40 mg total) by mouth daily at 12 noon. 30 capsule 11   spironolactone  (ALDACTONE ) 25 MG tablet Take 1 tablet (25 mg total) by mouth daily. 90  tablet 3   escitalopram  (LEXAPRO ) 5 MG tablet Take 1 tablet (5 mg total) by mouth daily. 30 tablet 0   hydrOXYzine  (ATARAX ) 10 MG tablet Take 1 tablet (10 mg total) by mouth 3 (three) times daily as needed for anxiety. 90 tablet 1   lisinopril  (ZESTRIL ) 40 MG tablet Take 1 tablet (40 mg total) by mouth daily. 90 tablet 0   benzonatate  (TESSALON ) 100 MG capsule Take 1 capsule (100 mg total) by mouth every 8 (eight) hours. 15 capsule 0   promethazine -dextromethorphan (PROMETHAZINE -DM) 6.25-15 MG/5ML syrup Take 5 mLs by mouth 4 (four) times daily as needed for cough. 118 mL 0   sucralfate  (CARAFATE ) 1 GM/10ML suspension Take 10 mLs (1 g total) by mouth 4 (four) times daily -  with meals and at bedtime. 420 mL 0   No facility-administered medications prior to visit.   Allergies  Allergen Reactions   Penicillins Rash   Objective:   Today's Vitals   03/09/24 1510  BP: 136/80  Pulse: 89  Temp: 97.6 F (36.4 C)  TempSrc: Temporal  SpO2: 98%  Weight: 142 lb (64.4 kg)  Height: 5\' 3"  (1.6 m)   Body mass index is 25.15 kg/m.   General: Well developed, well nourished. No acute distress. Lungs: Clear to auscultation bilaterally. No wheezing, rales or rhonchi. CV: RRR without murmurs or rubs. Pulses 2+ bilaterally. Psych: Alert and oriented. Normal mood and affect.  Health Maintenance Due  Topic Date Due   HIV Screening  Never done   Hepatitis C Screening  Never done   Imaging: Echocardiogram (07/27/2021) IMPRESSIONS   1. Left ventricular ejection fraction, by estimation, is 55 to 60%. The left ventricle has normal function. The left ventricle has no regional wall motion abnormalities. Left ventricular diastolic parameters were normal.   2. Right ventricular systolic function is normal. The right ventricular size is normal. Tricuspid regurgitation signal is inadequate for assessing PA pressure.   3. The mitral valve is grossly normal. Trivial mitral valve regurgitation. No evidence of  mitral stenosis.   4. The aortic valve is tricuspid. Aortic valve regurgitation is trivial. No aortic stenosis is present.   5. The inferior vena cava is normal in size with greater than 50% respiratory variability, suggesting right atrial pressure of 3 mmHg.   Conclusion(s)/Recommendation(s): Normal biventricular function without evidence of hemodynamically significant valvular heart disease.   Lab Results Last lipids Lab Results  Component Value Date   CHOL 216 (H) 12/21/2022   HDL 81.90 12/21/2022   LDLCALC 125 (H) 12/21/2022   TRIG 48.0 12/21/2022   CHOLHDL 3 12/21/2022   The 10-year ASCVD risk score (Arnett DK, et al., 2019) is: 2%   Values used to calculate the score:     Age: 58 years     Sex: Female     Is Non-Hispanic African American: No     Diabetic: No     Tobacco smoker: No  Systolic Blood Pressure: 136 mmHg     Is BP treated: Yes     HDL Cholesterol: 81.9 mg/dL     Total Cholesterol: 216 mg/dL    Assessment & Plan:   Problem List Items Addressed This Visit       Cardiovascular and Mediastinum   Essential hypertension - Primary   BP is mildly elevated today. Continue lisinopril  40 mg daily and spironolactone  25 mg daily. I will try adding metoprolol succinate 25 mg daily, esp. as there may be a component of anxiety/stress related to her  BP issues.      Relevant Medications   lisinopril  (ZESTRIL ) 40 MG tablet   metoprolol succinate (TOPROL-XL) 25 MG 24 hr tablet   Other Relevant Orders   Comprehensive metabolic panel with GFR     Digestive   Nonalcoholic fatty liver   Noted on a previous scan as mild. I will reassess liver enzymes today. Weight loss is the most effective remedy for this.      Relevant Orders   Comprehensive metabolic panel with GFR     Other   Anxiety, generalized   I recommend we restart escitalopram  5 mg daily and see her back in 6 weeks. I strongly encouraged her to give this an adequate trial.      Relevant Medications    hydrOXYzine  (ATARAX ) 10 MG tablet   escitalopram  (LEXAPRO ) 5 MG tablet   Borderline hyperlipidemia   I will reassess her lipids today. Her AHA/ACC CV Risk is very low at this point. I recommend eating a heart-healthy diet (DASH diet or Mediterranean), getting 150 minutes of moderate-intensity exercise each week, maintaining a normal weight, and avoiding tobacco products. I reassured her that her previous heart evaluation has been very normal. Currently we should focus on optimally managing her BP.      Relevant Medications   lisinopril  (ZESTRIL ) 40 MG tablet   metoprolol succinate (TOPROL-XL) 25 MG 24 hr tablet   Other Relevant Orders   Lipid panel    Return in about 6 weeks (around 04/20/2024) for Reassessment.   Graig Lawyer, MD

## 2024-03-09 NOTE — Assessment & Plan Note (Signed)
 BP is mildly elevated today. Continue lisinopril  40 mg daily and spironolactone  25 mg daily. I will try adding metoprolol succinate 25 mg daily, esp. as there may be a component of anxiety/stress related to her  BP issues.

## 2024-03-09 NOTE — Assessment & Plan Note (Signed)
 I recommend we restart escitalopram  5 mg daily and see her back in 6 weeks. I strongly encouraged her to give this an adequate trial.

## 2024-03-09 NOTE — Assessment & Plan Note (Signed)
 Noted on a previous scan as mild. I will reassess liver enzymes today. Weight loss is the most effective remedy for this.

## 2024-03-10 LAB — COMPREHENSIVE METABOLIC PANEL WITH GFR
AG Ratio: 2 (calc) (ref 1.0–2.5)
ALT: 18 U/L (ref 6–29)
AST: 17 U/L (ref 10–35)
Albumin: 4.7 g/dL (ref 3.6–5.1)
Alkaline phosphatase (APISO): 81 U/L (ref 37–153)
BUN: 11 mg/dL (ref 7–25)
CO2: 28 mmol/L (ref 20–32)
Calcium: 9.3 mg/dL (ref 8.6–10.4)
Chloride: 102 mmol/L (ref 98–110)
Creat: 0.79 mg/dL (ref 0.50–1.03)
Globulin: 2.4 g/dL (ref 1.9–3.7)
Glucose, Bld: 103 mg/dL — ABNORMAL HIGH (ref 65–99)
Potassium: 4.2 mmol/L (ref 3.5–5.3)
Sodium: 139 mmol/L (ref 135–146)
Total Bilirubin: 0.4 mg/dL (ref 0.2–1.2)
Total Protein: 7.1 g/dL (ref 6.1–8.1)
eGFR: 89 mL/min/{1.73_m2} (ref >=60)

## 2024-03-10 LAB — LIPID PANEL
Cholesterol: 204 mg/dL — ABNORMAL HIGH (ref <200)
HDL: 80 mg/dL (ref >=50)
LDL Cholesterol (Calc): 110 mg/dL — ABNORMAL HIGH
Non-HDL Cholesterol (Calc): 124 mg/dL (ref <130)
Total CHOL/HDL Ratio: 2.6 (calc) (ref <5.0)
Triglycerides: 56 mg/dL (ref <150)

## 2024-03-12 ENCOUNTER — Ambulatory Visit: Payer: Self-pay | Admitting: Family Medicine

## 2024-03-30 DIAGNOSIS — R309 Painful micturition, unspecified: Secondary | ICD-10-CM | POA: Diagnosis not present

## 2024-03-30 DIAGNOSIS — L292 Pruritus vulvae: Secondary | ICD-10-CM | POA: Diagnosis not present

## 2024-03-31 DIAGNOSIS — L03312 Cellulitis of back [any part except buttock]: Secondary | ICD-10-CM | POA: Diagnosis not present

## 2024-03-31 DIAGNOSIS — W57XXXA Bitten or stung by nonvenomous insect and other nonvenomous arthropods, initial encounter: Secondary | ICD-10-CM | POA: Diagnosis not present

## 2024-04-11 ENCOUNTER — Other Ambulatory Visit: Payer: Self-pay | Admitting: Medical-Surgical

## 2024-04-11 DIAGNOSIS — Z1231 Encounter for screening mammogram for malignant neoplasm of breast: Secondary | ICD-10-CM

## 2024-04-19 ENCOUNTER — Ambulatory Visit: Admitting: Family Medicine

## 2024-05-11 ENCOUNTER — Other Ambulatory Visit: Payer: Self-pay | Admitting: Family Medicine

## 2024-05-11 ENCOUNTER — Ambulatory Visit
Admission: RE | Admit: 2024-05-11 | Discharge: 2024-05-11 | Disposition: A | Discharge status: Home / Self Care | Source: Ambulatory Visit | Attending: Medical-Surgical | Admitting: Medical-Surgical

## 2024-05-11 DIAGNOSIS — Z1231 Encounter for screening mammogram for malignant neoplasm of breast: Secondary | ICD-10-CM

## 2024-05-17 ENCOUNTER — Other Ambulatory Visit (HOSPITAL_COMMUNITY): Payer: Self-pay

## 2024-05-17 ENCOUNTER — Telehealth: Payer: Self-pay

## 2024-05-17 DIAGNOSIS — M25562 Pain in left knee: Secondary | ICD-10-CM | POA: Diagnosis not present

## 2024-05-17 DIAGNOSIS — M25561 Pain in right knee: Secondary | ICD-10-CM | POA: Diagnosis not present

## 2024-05-17 NOTE — Telephone Encounter (Addendum)
 Pharmacy Patient Advocate Encounter   Received notification from CoverMyMeds that prior authorization for NEXIUM  20 MG delayed-release capsules is due for renewal.   Insurance verification completed.   The patient is insured through [facility].  Action: The current 30 day co-pay is, $4.00.  No PA needed at this time. This test claim was processed through [facility]- copay amounts may vary at other pharmacies due to pharmacy/plan contracts, or as the patient moves through the different stages of their insurance plan.

## 2024-05-21 ENCOUNTER — Other Ambulatory Visit (HOSPITAL_COMMUNITY): Payer: Self-pay

## 2024-05-22 DIAGNOSIS — D3132 Benign neoplasm of left choroid: Secondary | ICD-10-CM | POA: Diagnosis not present

## 2024-06-05 ENCOUNTER — Encounter: Payer: Self-pay | Admitting: Sports Medicine

## 2024-06-06 DIAGNOSIS — M2241 Chondromalacia patellae, right knee: Secondary | ICD-10-CM | POA: Diagnosis not present

## 2024-06-06 DIAGNOSIS — M25562 Pain in left knee: Secondary | ICD-10-CM | POA: Diagnosis not present

## 2024-06-06 DIAGNOSIS — M2242 Chondromalacia patellae, left knee: Secondary | ICD-10-CM | POA: Diagnosis not present

## 2024-06-06 DIAGNOSIS — M25561 Pain in right knee: Secondary | ICD-10-CM | POA: Diagnosis not present

## 2024-06-07 DIAGNOSIS — R61 Generalized hyperhidrosis: Secondary | ICD-10-CM | POA: Diagnosis not present

## 2024-06-07 DIAGNOSIS — B351 Tinea unguium: Secondary | ICD-10-CM | POA: Diagnosis not present

## 2024-06-07 DIAGNOSIS — L600 Ingrowing nail: Secondary | ICD-10-CM | POA: Diagnosis not present

## 2024-06-07 DIAGNOSIS — M792 Neuralgia and neuritis, unspecified: Secondary | ICD-10-CM | POA: Diagnosis not present

## 2024-06-21 DIAGNOSIS — R61 Generalized hyperhidrosis: Secondary | ICD-10-CM | POA: Diagnosis not present

## 2024-06-21 DIAGNOSIS — L600 Ingrowing nail: Secondary | ICD-10-CM | POA: Diagnosis not present

## 2024-06-21 DIAGNOSIS — M792 Neuralgia and neuritis, unspecified: Secondary | ICD-10-CM | POA: Diagnosis not present

## 2024-06-21 DIAGNOSIS — B351 Tinea unguium: Secondary | ICD-10-CM | POA: Diagnosis not present

## 2024-07-12 ENCOUNTER — Encounter: Payer: Self-pay | Admitting: Internal Medicine

## 2024-07-12 ENCOUNTER — Ambulatory Visit: Admitting: Internal Medicine

## 2024-07-12 VITALS — BP 128/90 | HR 73 | Temp 95.5°F | Ht 63.0 in | Wt 140.8 lb

## 2024-07-12 DIAGNOSIS — L0291 Cutaneous abscess, unspecified: Secondary | ICD-10-CM | POA: Diagnosis not present

## 2024-07-12 MED ORDER — MUPIROCIN 2 % EX OINT: 1.0000 | TOPICAL_OINTMENT | Freq: Two times a day (BID) | CUTANEOUS | 1 refills | Status: DC

## 2024-07-12 MED ORDER — SULFAMETHOXAZOLE-TRIMETHOPRIM 800-160 MG PO TABS: 1.0000 | ORAL_TABLET | Freq: Two times a day (BID) | ORAL | 0 refills | Status: AC

## 2024-07-12 NOTE — Patient Instructions (Signed)
 Warm compresses  Keep area covered at work. Open to air when at home.

## 2024-07-12 NOTE — Progress Notes (Signed)
 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     HPI:  History of Present Illness   Burnell Matlin is a 53 year old female who presents with a possible skin infection on her arm.  Several days ago, she noticed a small red bump on her right arm, which she initially covered. Yesterday, it began filling with pus, causing concern. She reports no recent tick, spider, or insect bites in the area, although she recalls a tick bite several months ago, but not in the same location. The lesion has not been oozing, but she describes a burning sensation upon touch. No spread or additional bumps have been observed.  She has not applied any treatments to the area yet, as it was initially a small, barely noticeable bump. Over the past few days, it has progressed, and she is uncertain if it will continue to grow. She is concerned about the possibility of MRSA, especially given her work environment in a skilled facility.    The following portions of the patient's history were reviewed and updated as appropriate: past medical history, past surgical history, family history, social history, allergies, medications, and problem list.   Patient Active Problem List   Diagnosis Date Noted   Nonalcoholic fatty liver 03/09/2024   Acute non-recurrent maxillary sinusitis 10/04/2023   Gastritis without bleeding 08/19/2023   Adrenal adenoma, left 07/15/2023   Meralgia paraesthetica, left 04/04/2023   Vitamin B12 deficiency 02/15/2023   Numbness and tingling of both legs 02/14/2023   Uterine fibroid  12/06/2022   Left sided sciatica 12/06/2022   Cervical spondylosis 10/05/2022   Hyponatremia 02/17/2022   Pharyngeal dysphagia 01/05/2022   Anxiety, generalized 09/11/2020   Essential hypertension 01/02/2020   Gastroesophageal reflux disease    Laryngopharyngeal reflux (LPR) 05/01/2018   Borderline hyperlipidemia 02/23/2018   Chronic idiopathic constipation 12/21/2017   S/P LEEP 12/21/2016   Past Medical History:  Diagnosis Date   Acid reflux    Hypertension    Vaginal Pap smear, abnormal    Past Surgical History:  Procedure Laterality Date   57 HOUR PH STUDY N/A 04/24/2018   Procedure: 24 HOUR PH STUDY;  Surgeon: Shila Gustav GAILS, MD;  Location: [facility];  Service: Endoscopy;  Laterality: N/A;   CERVICAL BIOPSY  W/ LOOP ELECTRODE EXCISION     COLPOSCOPY     ESOPHAGEAL MANOMETRY N/A 04/24/2018   Procedure: ESOPHAGEAL MANOMETRY (EM);  Surgeon: Shila Gustav GAILS, MD;  Location: [facility];  Service: Endoscopy;  Laterality: N/A;   LEEP     PH IMPEDANCE STUDY  04/24/2018   Procedure: PH IMPEDANCE STUDY;  Surgeon: Shila Gustav GAILS, MD;  Location: [facility];  Service: Endoscopy;;   TONSILLECTOMY     Family History  Problem Relation Age of Onset   Cancer Mother        Colon   Alcohol abuse Mother    Hypertension Mother    Peripheral Artery Disease Mother    Cancer Brother        Kidney   Heart disease Brother  Hypertension Brother    Hypertension Brother    Stroke Paternal Grandmother    Diabetes Paternal Grandfather     Current Outpatient Medications:    Cholecalciferol (VITAMIN D ) 125 MCG (5000 UT) CAPS, Take 1 capsule by mouth daily., Disp: , Rfl:    cyanocobalamin  (VITAMIN B12) 1000 MCG tablet, Take 1 tablet (1,000 mcg total) by mouth daily., Disp: 100 tablet, Rfl: 2   hydrOXYzine  (ATARAX ) 10 MG tablet, Take 1 tablet (10 mg total) by mouth 3 (three) times daily as needed for anxiety., Disp: 90 tablet, Rfl: 1   lisinopril  (ZESTRIL ) 40 MG tablet, Take 1  tablet (40 mg total) by mouth daily., Disp: 90 tablet, Rfl: 0   mupirocin ointment (BACTROBAN) 2 %, Apply 1 Application topically 2 (two) times daily., Disp: 22 g, Rfl: 1   spironolactone  (ALDACTONE ) 25 MG tablet, Take 1 tablet (25 mg total) by mouth daily., Disp: 90 tablet, Rfl: 3   sulfamethoxazole-trimethoprim (BACTRIM DS) 800-160 MG tablet, Take 1 tablet by mouth 2 (two) times daily for 7 days., Disp: 14 tablet, Rfl: 0   escitalopram  (LEXAPRO ) 5 MG tablet, Take 1 tablet (5 mg total) by mouth daily. (Patient not taking: Reported on 07/12/2024), Disp: 30 tablet, Rfl: 0   metoprolol  succinate (TOPROL -XL) 25 MG 24 hr tablet, Take 1 tablet (25 mg total) by mouth daily. (Patient not taking: Reported on 07/12/2024), Disp: 90 tablet, Rfl: 3   NEXIUM  40 MG capsule, Take 1 capsule (40 mg total) by mouth daily at 12 noon. (Patient not taking: Reported on 07/12/2024), Disp: 30 capsule, Rfl: 11 Allergies  Allergen Reactions   Penicillins Rash     ROS: A complete ROS was performed with pertinent positives/negatives noted in the HPI. The remainder of the ROS are negative.    Objective:   Today's Vitals   07/12/24 0917  BP: (!) 128/90  Pulse: 73  Temp: (!) 95.5 F (35.3 C)  TempSrc: Temporal  SpO2: 99%  Weight: 140 lb 12.8 oz (63.9 kg)  Height: 5' 3 (1.6 m)    GENERAL: Well-appearing, in NAD. Well nourished.  SKIN: Pink, warm and dry. Single pustule to anterior aspect of R. Forearm with small amount of surrounding erythema RESPIRATORY: Chest wall symmetrical. Respirations even and non-labored CARDIAC: Peripheral pulses 2+ bilaterally.  MSK:  Joints w/o tenderness, redness, or swelling. PSYCH/MENTAL STATUS: Alert, oriented x 3. Cooperative, appropriate mood and affect.    No results found for any visits on 07/12/24.    Assessment & Plan:  Assessment and Plan    Skin abscess - Prescribed bactrim 800-160mg  Po BID x 7 days - Prescribed mupirocin ointment, apply to affected area twice  daily. - Advised warm compresses - Instructed to keep area covered at work and open to air at home if not oozing. - Discussed potential side effects of oral antibiotic, including upset stomach and diarrhea, advised taking with food.     Meds ordered this encounter  Medications   mupirocin ointment (BACTROBAN) 2 %    Sig: Apply 1 Application topically 2 (two) times daily.    Dispense:  22 g    Refill:  1    Supervising Provider:   THOMPSON, AARON B [[id number]]   sulfamethoxazole-trimethoprim (BACTRIM DS) 800-160 MG tablet    Sig: Take 1 tablet by mouth 2 (two) times daily for 7 days.    Dispense:  14 tablet    Refill:  0    Supervising Provider:   THOMPSON, AARON B [[id number]]   No orders  of the defined types were placed in this encounter.  Lab Orders  No laboratory test(s) ordered today   No images are attached to the encounter or orders placed in the encounter.  Return if symptoms worsen or fail to improve.   Rosina Senters, FNP

## 2024-07-26 DIAGNOSIS — R61 Generalized hyperhidrosis: Secondary | ICD-10-CM | POA: Diagnosis not present

## 2024-07-26 DIAGNOSIS — B351 Tinea unguium: Secondary | ICD-10-CM | POA: Diagnosis not present

## 2024-08-28 ENCOUNTER — Ambulatory Visit: Payer: Self-pay

## 2024-08-28 NOTE — Telephone Encounter (Signed)
 Noted. Dm/cma

## 2024-08-28 NOTE — Telephone Encounter (Signed)
 FYI Only or Action Required?: FYI only for provider: appointment scheduled on 08/29/24.  Patient was last seen in primary care on 07/12/2024 by Billy Knee, FNP.  Called Nurse Triage reporting Dysuria.  Symptoms began several days ago.  Interventions attempted: Nothing.  Symptoms are: gradually worsening.  Triage Disposition: See Physician Within 24 Hours  Patient/caregiver understands and will follow disposition?: Yes  Copied from CRM #[id number]. Topic: Clinical - Red Word Triage >> Aug 28, 2024  9:27 AM Thersia BROCKS wrote: [facility] that prompted transfer to Nurse Triage:Patient called in stated not sure if she has kidney or bladder infection, is in pain and burns when urinating Reason for Disposition  Age > 50 years  Answer Assessment - Initial Assessment Questions Pt reports onset of burning with urination a few days ago. Denies blood in urine or fever. Scheduled appt with provider at different office for tomorrow in pt region d/t no availability at home office with any provider within timeframe. Pt ended call after scheduling, had to take another phone call. Unable to advise UC or ED for worsening symptoms.   1. SEVERITY: How bad is the pain?  (e.g., Scale 1-10; mild, moderate, or severe)     Burning with urination  2. FREQUENCY: How many times have you had painful urination today?      *No Answer*  3. PATTERN: Is pain present every time you urinate or just sometimes?      Every time  4. ONSET: When did the painful urination start?      A few days ago  5. FEVER: Do you have a fever? If Yes, ask: What is your temperature, how was it measured, and when did it start?     Denies  6. PAST UTI: Have you had a urine infection before? If Yes, ask: When was the last time? and What happened that time?      Yes, had to take abx's  7. CAUSE: What do you think is causing the painful urination?  (e.g., UTI, scratch, Herpes sore)     UTI  8. OTHER SYMPTOMS: Do you  have any other symptoms? (e.g., blood in urine, flank pain, genital sores, urgency, vaginal discharge)     Back pain  Protocols used: Urination Pain - Female-A-AH

## 2024-08-29 ENCOUNTER — Telehealth: Payer: Self-pay | Admitting: Family Medicine

## 2024-08-29 ENCOUNTER — Other Ambulatory Visit (HOSPITAL_COMMUNITY)
Admission: RE | Admit: 2024-08-29 | Discharge: 2024-08-29 | Disposition: A | Discharge status: Home / Self Care | Source: Ambulatory Visit | Attending: Family | Admitting: Family

## 2024-08-29 ENCOUNTER — Ambulatory Visit: Admitting: Family

## 2024-08-29 ENCOUNTER — Encounter: Payer: Self-pay | Admitting: Family

## 2024-08-29 VITALS — BP 132/88 | HR 68 | Temp 97.8°F | Resp 16 | Ht 63.0 in | Wt 139.0 lb

## 2024-08-29 DIAGNOSIS — R300 Dysuria: Secondary | ICD-10-CM | POA: Diagnosis present

## 2024-08-29 DIAGNOSIS — I10 Essential (primary) hypertension: Secondary | ICD-10-CM

## 2024-08-29 DIAGNOSIS — R350 Frequency of micturition: Secondary | ICD-10-CM

## 2024-08-29 LAB — POCT URINALYSIS DIPSTICK
Bilirubin, UA: NEGATIVE
Blood, UA: NEGATIVE
Glucose, UA: NEGATIVE
Ketones, UA: NEGATIVE
Leukocytes, UA: NEGATIVE
Nitrite, UA: NEGATIVE
Protein, UA: NEGATIVE
Spec Grav, UA: <=1.005 — AB (ref 1.010–1.025)
Urobilinogen, UA: 0.2 U/dL
pH, UA: 5 (ref 5.0–8.0)

## 2024-08-29 MED ORDER — LISINOPRIL 40 MG PO TABS: 40.0000 mg | ORAL_TABLET | Freq: Every day | ORAL | 0 refills | Status: AC

## 2024-08-29 MED ORDER — CLINDAMYCIN HCL 300 MG PO CAPS: 300.0000 mg | ORAL_CAPSULE | Freq: Two times a day (BID) | ORAL | 0 refills | Status: DC

## 2024-08-29 NOTE — Telephone Encounter (Signed)
RX sent to the pharmacy and patient notified VIA phone.  Dm/cma ° °

## 2024-08-29 NOTE — Telephone Encounter (Signed)
 Prescription Request  08/29/2024  LOV: 03/09/2024  What is the name of the medication or equipment? lisinopril  (ZESTRIL ) 10 MG tablet   Have you contacted your pharmacy to request a refill? Yes   Which pharmacy would you like this sent to?  [facility] [address], [place] - [address] [place] [address] Phone: [phone] Fax: [phone]  [facility] [address], [place] - [address] [facility] [address] [place] [address] Phone: [phone] Fax: [phone]    Patient notified that their request is being sent to the clinical staff for review and that they should receive a response within 2 business days.   Please advise at Mobile [phone] (mobile)

## 2024-08-29 NOTE — Progress Notes (Signed)
 Acute Office Visit  Subjective:     Patient ID: Maria Mccarty, female    DOB: 1971/08/28, 53 y.o.   MRN: [id number]  Chief Complaint  Patient presents with  . Acute Visit    Patient presents today for dysuria.    HPI Patient is in today with complaints of burning with urination and frequency over the last week.  She has a history of urinary tract infections and bacterial vaginosis.  Both of which have caused similar symptoms for her in the past.  She is not sexually active.  Denies any vaginal discharge or odor.  Denies any pelvic pain.   Review of Systems  Constitutional:  Negative for chills and fever.  Genitourinary:  Positive for dysuria and frequency. Negative for hematuria.  Musculoskeletal: Negative.   Psychiatric/Behavioral: Negative.    All other systems reviewed and are negative.       Objective:    BP 132/88   Pulse 68   Temp 97.8 F (36.6 C)   Resp 16   Ht 5' 3 (1.6 m)   Wt 139 lb (63 kg)   LMP 12/05/2017 (Exact Date)   SpO2 98%   BMI 24.62 kg/m    Physical Exam Vitals and nursing note reviewed.  Constitutional:      Appearance: Normal appearance. She is normal weight.  Cardiovascular:     Rate and Rhythm: Normal rate and regular rhythm.     Pulses: Normal pulses.     Heart sounds: Normal heart sounds.  Pulmonary:     Effort: Pulmonary effort is normal.     Breath sounds: Normal breath sounds.  Abdominal:     General: Abdomen is flat. Bowel sounds are normal.  Genitourinary:    Comments: Self collected vaginal swab obtained Musculoskeletal:        General: Normal range of motion.  Skin:    General: Skin is warm and dry.  Neurological:     General: No focal deficit present.     Mental Status: She is alert and oriented to person, place, and time. Mental status is at baseline.  Psychiatric:        Mood and Affect: Mood normal.        Behavior: Behavior normal.        Thought Content: Thought content normal.    Results for orders  placed or performed in visit on 08/29/24  POCT urinalysis dipstick  Result Value Ref Range   Color, UA Yellow    Clarity, UA Clear    Glucose, UA Negative Negative   Bilirubin, UA Negative    Ketones, UA Negative    Spec Grav, UA <=1.005 (A) 1.010 - 1.025   Blood, UA Negative    pH, UA 5.0 5.0 - 8.0   Protein, UA Negative Negative   Urobilinogen, UA 0.2 0.2 or 1.0 E.U./dL   Nitrite, UA Negative    Leukocytes, UA Negative Negative   Appearance     Odor          Assessment & Plan:   Problem List Items Addressed This Visit   None Visit Diagnoses       Dysuria    -  Primary   Relevant Orders   POCT urinalysis dipstick (Completed)   Urine Culture   Cervicovaginal ancillary only( [place])     Urinary frequency           Meds ordered this encounter  Medications  . clindamycin  (CLEOCIN ) 300 MG capsule  Sig: Take 1 capsule (300 mg total) by mouth 2 (two) times daily.    Dispense:  14 capsule    Refill:  0   Will cover with clindamycin  to treat BV and potentially UTI.  Urine culture was sent today as well as the wet mount.  Will notify patient pending results.  Drink plenty of fluids.  Call the office with any questions or concerns.  Recheck as scheduled and sooner as needed. No follow-ups on file.  Price Lachapelle B Madoline Bhatt, FNP

## 2024-08-30 LAB — URINE CULTURE
MICRO NUMBER:: 17287004
Result:: NO GROWTH
SPECIMEN QUALITY:: ADEQUATE

## 2024-08-31 LAB — CERVICOVAGINAL ANCILLARY ONLY
Bacterial Vaginitis (gardnerella): NEGATIVE
Candida Glabrata: NEGATIVE
Candida Vaginitis: NEGATIVE
Comment: NEGATIVE
Comment: NEGATIVE
Comment: NEGATIVE
Comment: NEGATIVE
Trichomonas: NEGATIVE

## 2024-09-01 ENCOUNTER — Ambulatory Visit: Payer: Self-pay | Admitting: Family

## 2024-09-12 ENCOUNTER — Ambulatory Visit: Payer: Self-pay

## 2024-09-12 NOTE — Telephone Encounter (Signed)
 2nd call back attempt: LVM to call clinic back

## 2024-09-12 NOTE — Telephone Encounter (Signed)
 Patient disconnected during triage hold time. Attempted to contact patient x 1 to discuss symptoms; no answer. Will attempt to contact patient at a later time to further discuss concerns.    FYI: Patient may be on her way to UC, per Specialist.       Red Word that prompted transfer to Nurse Triage: Patient has been experiencing body aches, chills, sneezing, can't stand up without feeling like going to pass out woke up with it yesterday would like to check for flu or covid

## 2024-09-12 NOTE — Telephone Encounter (Signed)
 FYI Only or Action Required?: FYI only for provider: appointment scheduled on 09/13/24.  Patient was last seen in primary care on 08/29/2024 by Douglass Kenney NOVAK, FNP.  Called Nurse Triage reporting Chills.  Symptoms began several days ago.  Interventions attempted: OTC medications: Theraflu, Emergen-C and Rest, hydration, or home remedies.  Symptoms are: stable.  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   1. ONSET: When did the muscle aches or body pains start?      Few days  2. LOCATION: What part of your body is hurting? (e.g., entire body, arms, legs)      Sinus pressure, generalized body aches  3. FEVER: Do you have a fever? If Yes, ask: What is your temperature, how was it measured, and  when did it start?      Denies  4. OTHER SYMPTOMS: Do you have any other symptoms? (e.g., chest pain, cold or flu symptoms, rash, weakness, weight loss)     Cold / flu symptoms. Sneezing, chills, then hot  Protocols used: Muscle Aches and Body Pain-A-AH

## 2024-09-13 ENCOUNTER — Ambulatory Visit (INDEPENDENT_AMBULATORY_CARE_PROVIDER_SITE_OTHER): Admitting: Medical-Surgical

## 2024-09-13 ENCOUNTER — Encounter: Payer: Self-pay | Admitting: Medical-Surgical

## 2024-09-13 VITALS — BP 156/95 | HR 67 | Temp 98.1°F | Resp 20 | Ht 63.0 in | Wt 139.6 lb

## 2024-09-13 DIAGNOSIS — I10 Essential (primary) hypertension: Secondary | ICD-10-CM | POA: Diagnosis not present

## 2024-09-13 DIAGNOSIS — J069 Acute upper respiratory infection, unspecified: Secondary | ICD-10-CM | POA: Diagnosis not present

## 2024-09-13 LAB — POC COVID19/FLU A&B COMBO
Covid Antigen, POC: NEGATIVE
Influenza A Antigen, POC: NEGATIVE
Influenza B Antigen, POC: NEGATIVE

## 2024-09-13 NOTE — Progress Notes (Signed)
 Medical screening examination/treatment was performed by qualified clinical staff member and as supervising provider I was immediately available for consultation/collaboration. I have reviewed documentation and agree with assessment and plan.  Thayer Ohm, DNP, APRN, FNP-BC [facility]

## 2024-09-13 NOTE — Progress Notes (Addendum)
° °  Acute Office Visit  Subjective:     Patient ID: Maria Mccarty, female    DOB: [birth year], 53 y.o.   MRN: [id number]  Chief Complaint  Patient presents with   Chills   Generalized Body Aches   SNEEZING   Nasal Congestion    HPI Patient is in today for chills, body aches, sneezing, and nasal congestion since Tuesday. This morning she started having some some chest congestion with no cough. She has tried taking otc alka-seltzer, nasal saline rinse, and Emergen-C.  Reports recent sick contacts at work. Denies fever or shortness of breath  Review of Systems  Constitutional:  Positive for chills and malaise/fatigue.  HENT:  Positive for congestion and sinus pain.   Eyes: Negative.   Respiratory: Negative.    Cardiovascular: Negative.   Gastrointestinal: Negative.   Skin: Negative.         Objective:    BP (!) 156/95   Pulse 67   Temp 98.1 F (36.7 C) (Oral)   Resp 20   Ht 5' 3 (1.6 m)   Wt 63.3 kg   LMP 12/05/2017   SpO2 99%   BMI 24.73 kg/m  BP Readings from Last 3 Encounters:  09/13/24 (!) 156/95  08/29/24 132/88  07/12/24 (!) 128/90      Physical Exam Vitals and nursing note reviewed.  Constitutional:      General: She is not in acute distress.    Appearance: Normal appearance.  HENT:     Nose: Congestion and rhinorrhea present.     Mouth/Throat:     Mouth: Mucous membranes are moist.     Pharynx: Oropharynx is clear.  Cardiovascular:     Rate and Rhythm: Normal rate and regular rhythm.     Pulses: Normal pulses.     Heart sounds: Normal heart sounds.  Pulmonary:     Effort: Pulmonary effort is normal.     Breath sounds: Normal breath sounds.  Neurological:     General: No focal deficit present.     Mental Status: She is alert and oriented to person, place, and time.  Psychiatric:        Mood and Affect: Mood normal.        Behavior: Behavior normal.        Thought Content: Thought content normal.        Judgment: Judgment normal.      Results for orders placed or performed in visit on 09/13/24  POC Covid19/Flu A&B Antigen  Result Value Ref Range   Influenza A Antigen, POC Negative Negative   Influenza B Antigen, POC Negative Negative   Covid Antigen, POC Negative Negative        Assessment & Plan:  1. Viral upper respiratory infection (Primary) -Negative POCT Flu A/B and Covid -Recommended conservative treatment such as Coricidin HBP, Dayquil and Nyquil for HBP. -Recommended Vicks Vapo rub and continue using saline nasal rinse and Emergen-C  -Follow up if symptoms worsen -Work note provided  2. Essential hypertension - Patient advised to take cold medication formulated for hypertension - Continue to monitor readings at home - Follow up with PCP  No orders of the defined types were placed in this encounter.   Return if symptoms worsen or fail to improve.  Derrek JINNY Freund, NP Student
